# Patient Record
Sex: Female | Born: 1945 | Race: Black or African American | Hispanic: No | Marital: Married | Smoking: Never smoker
Health system: Southern US, Community
[De-identification: ages and names within clinical notes are randomized; demographics above are authoritative.]

## PROBLEM LIST (undated history)

## (undated) DIAGNOSIS — K219 Gastro-esophageal reflux disease without esophagitis: Secondary | ICD-10-CM

## (undated) DIAGNOSIS — E1165 Type 2 diabetes mellitus with hyperglycemia: Secondary | ICD-10-CM

## (undated) DIAGNOSIS — E1169 Type 2 diabetes mellitus with other specified complication: Secondary | ICD-10-CM

## (undated) DIAGNOSIS — R011 Cardiac murmur, unspecified: Secondary | ICD-10-CM

## (undated) DIAGNOSIS — G4730 Sleep apnea, unspecified: Secondary | ICD-10-CM

## (undated) DIAGNOSIS — E119 Type 2 diabetes mellitus without complications: Secondary | ICD-10-CM

## (undated) DIAGNOSIS — N183 Chronic kidney disease, stage 3 unspecified: Secondary | ICD-10-CM

## (undated) DIAGNOSIS — I10 Essential (primary) hypertension: Secondary | ICD-10-CM

## (undated) HISTORY — PX: CHOLECYSTECTOMY: SHX55

## (undated) HISTORY — PX: HERNIA REPAIR: SHX51

## (undated) HISTORY — PX: ABDOMINAL HYSTERECTOMY: SHX81

## (undated) HISTORY — DX: Type 2 diabetes mellitus with other specified complication: E11.69

## (undated) HISTORY — DX: Chronic kidney disease, stage 3 unspecified: N18.30

## (undated) HISTORY — PX: REPLACEMENT TOTAL KNEE: SUR1224

## (undated) HISTORY — DX: Type 2 diabetes mellitus with hyperglycemia: E11.65

---

## 2008-01-17 ENCOUNTER — Ambulatory Visit: Payer: Self-pay | Admitting: Family Medicine

## 2008-05-12 ENCOUNTER — Ambulatory Visit: Payer: Self-pay | Admitting: Cardiovascular Disease

## 2008-07-14 ENCOUNTER — Ambulatory Visit: Payer: Self-pay | Admitting: Internal Medicine

## 2008-08-11 ENCOUNTER — Ambulatory Visit: Payer: Self-pay | Admitting: Gastroenterology

## 2009-08-01 ENCOUNTER — Ambulatory Visit: Payer: Self-pay | Admitting: Internal Medicine

## 2010-08-10 ENCOUNTER — Ambulatory Visit: Payer: Self-pay | Admitting: Internal Medicine

## 2011-08-21 ENCOUNTER — Ambulatory Visit: Payer: Self-pay | Admitting: Internal Medicine

## 2012-03-20 ENCOUNTER — Emergency Department: Payer: Self-pay | Admitting: Urology

## 2012-03-20 LAB — CBC
HCT: 34.9 % — ABNORMAL LOW (ref 35.0–47.0)
HGB: 11.5 g/dL — ABNORMAL LOW (ref 12.0–16.0)
MCH: 28.6 pg (ref 26.0–34.0)
MCHC: 32.8 g/dL (ref 32.0–36.0)
MCV: 87 fL (ref 80–100)
Platelet: 234 10*3/uL (ref 150–440)
RBC: 4.01 10*6/uL (ref 3.80–5.20)

## 2012-03-20 LAB — COMPREHENSIVE METABOLIC PANEL
Albumin: 3.5 g/dL (ref 3.4–5.0)
Alkaline Phosphatase: 91 U/L (ref 50–136)
Anion Gap: 7 (ref 7–16)
BUN: 15 mg/dL (ref 7–18)
Calcium, Total: 8.4 mg/dL — ABNORMAL LOW (ref 8.5–10.1)
Chloride: 103 mmol/L (ref 98–107)
Co2: 28 mmol/L (ref 21–32)
EGFR (African American): >60
Glucose: 149 mg/dL — ABNORMAL HIGH (ref 65–99)
SGOT(AST): 7 U/L — ABNORMAL LOW (ref 15–37)
SGPT (ALT): 17 U/L (ref 12–78)
Sodium: 138 mmol/L (ref 136–145)

## 2012-03-20 LAB — PRO B NATRIURETIC PEPTIDE: B-Type Natriuretic Peptide: 538 pg/mL — ABNORMAL HIGH (ref 0–125)

## 2012-03-20 LAB — TROPONIN I: Troponin-I: <0.02 ng/mL

## 2012-08-26 ENCOUNTER — Ambulatory Visit: Payer: Self-pay | Admitting: Internal Medicine

## 2012-09-12 DIAGNOSIS — R809 Proteinuria, unspecified: Secondary | ICD-10-CM | POA: Insufficient documentation

## 2013-01-08 ENCOUNTER — Ambulatory Visit: Payer: Self-pay | Admitting: Gastroenterology

## 2013-04-27 DIAGNOSIS — E669 Obesity, unspecified: Secondary | ICD-10-CM | POA: Insufficient documentation

## 2013-04-27 DIAGNOSIS — N183 Chronic kidney disease, stage 3 unspecified: Secondary | ICD-10-CM | POA: Insufficient documentation

## 2013-04-27 DIAGNOSIS — N1832 Chronic kidney disease, stage 3b: Secondary | ICD-10-CM | POA: Insufficient documentation

## 2013-09-17 ENCOUNTER — Ambulatory Visit: Payer: Self-pay | Admitting: Internal Medicine

## 2014-08-23 ENCOUNTER — Other Ambulatory Visit: Payer: Self-pay | Admitting: Internal Medicine

## 2014-08-23 DIAGNOSIS — Z1231 Encounter for screening mammogram for malignant neoplasm of breast: Secondary | ICD-10-CM

## 2014-09-20 ENCOUNTER — Other Ambulatory Visit: Payer: Self-pay | Admitting: Internal Medicine

## 2014-09-20 ENCOUNTER — Ambulatory Visit
Admission: RE | Admit: 2014-09-20 | Discharge: 2014-09-20 | Disposition: A | Discharge status: Home / Self Care | Payer: Medicare PPO | Source: Ambulatory Visit | Attending: Internal Medicine | Admitting: Internal Medicine

## 2014-09-20 DIAGNOSIS — Z1231 Encounter for screening mammogram for malignant neoplasm of breast: Secondary | ICD-10-CM

## 2015-04-07 ENCOUNTER — Ambulatory Visit
Admission: RE | Admit: 2015-04-07 | Discharge: 2015-04-07 | Disposition: A | Discharge status: Home / Self Care | Payer: Medicare Other | Source: Ambulatory Visit | Attending: Internal Medicine | Admitting: Internal Medicine

## 2015-04-07 ENCOUNTER — Other Ambulatory Visit: Payer: Self-pay | Admitting: Internal Medicine

## 2015-04-07 DIAGNOSIS — M5489 Other dorsalgia: Secondary | ICD-10-CM | POA: Insufficient documentation

## 2015-04-07 DIAGNOSIS — M5136 Other intervertebral disc degeneration, lumbar region: Secondary | ICD-10-CM | POA: Insufficient documentation

## 2015-04-07 DIAGNOSIS — R109 Unspecified abdominal pain: Secondary | ICD-10-CM

## 2015-04-08 ENCOUNTER — Other Ambulatory Visit: Payer: Self-pay | Admitting: Internal Medicine

## 2015-04-08 DIAGNOSIS — R109 Unspecified abdominal pain: Secondary | ICD-10-CM

## 2015-04-28 ENCOUNTER — Ambulatory Visit: Payer: Medicare PPO

## 2015-07-11 ENCOUNTER — Ambulatory Visit
Admission: EM | Admit: 2015-07-11 | Discharge: 2015-07-11 | Disposition: A | Discharge status: Home / Self Care | Payer: Medicare Other | Attending: Family Medicine | Admitting: Family Medicine

## 2015-07-11 ENCOUNTER — Encounter: Payer: Self-pay | Admitting: Emergency Medicine

## 2015-07-11 DIAGNOSIS — L03115 Cellulitis of right lower limb: Secondary | ICD-10-CM | POA: Diagnosis not present

## 2015-07-11 DIAGNOSIS — L02419 Cutaneous abscess of limb, unspecified: Secondary | ICD-10-CM

## 2015-07-11 DIAGNOSIS — L02415 Cutaneous abscess of right lower limb: Secondary | ICD-10-CM

## 2015-07-11 DIAGNOSIS — L03119 Cellulitis of unspecified part of limb: Principal | ICD-10-CM

## 2015-07-11 DIAGNOSIS — W1809XA Striking against other object with subsequent fall, initial encounter: Secondary | ICD-10-CM

## 2015-07-11 HISTORY — DX: Type 2 diabetes mellitus without complications: E11.9

## 2015-07-11 HISTORY — DX: Essential (primary) hypertension: I10

## 2015-07-11 MED ORDER — MUPIROCIN 2 % EX OINT: 1.0000 "application " | TOPICAL_OINTMENT | Freq: Two times a day (BID) | CUTANEOUS | Status: DC

## 2015-07-11 MED ORDER — ACETAMINOPHEN 500 MG PO TABS: 500.0000 mg | ORAL_TABLET | Freq: Four times a day (QID) | ORAL | Status: AC | PRN

## 2015-07-11 MED ORDER — CEPHALEXIN 500 MG PO CAPS: 500.0000 mg | ORAL_CAPSULE | Freq: Two times a day (BID) | ORAL | Status: AC

## 2015-07-11 MED ORDER — TETANUS-DIPHTH-ACELL PERTUSSIS 5-2.5-18.5 LF-MCG/0.5 IM SUSP
0.5000 mL | Freq: Once | INTRAMUSCULAR | Status: AC
  Administered 2015-07-11: 0.5 mL via INTRAMUSCULAR

## 2015-07-11 NOTE — ED Notes (Signed)
Pt with injury to right lower leg and is not healing well. Pt is diabetic.

## 2015-07-11 NOTE — ED Provider Notes (Signed)
CSN: [id number]     Arrival date & time 07/11/15  [id number] History   First MD Initiated Contact with Patient 07/11/15 1053     Chief Complaint  Patient presents with  . Leg Injury   (Consider location/radiation/quality/duration/timing/severity/associated sxs/prior Treatment) HPI Comments: Married african [place] female here for evaluation of right leg pain/wound/swelling.  Tried hydrogen peroxide and neosporin without any improvement.  Purulent discharge yesterday.  Hit leg on cement block wall in her carport when watering plants with hose and fell.  Cannot remember when last tetanus booster was thinks greater than 10 years ago.  FBS 140 yesterday "her usual"  PSHx denied  The history is provided by the patient.    Past Medical History  Diagnosis Date  . Diabetes mellitus without complication ([place])   . Hypertension    History reviewed. No pertinent past surgical history. Family History  Problem Relation Age of Onset  . Breast cancer Neg Hx    Social History  Substance Use Topics  . Smoking status: Never Smoker   . Smokeless tobacco: None  . Alcohol Use: No   OB History    No data available     Review of Systems  Constitutional: Negative for fever, chills, diaphoresis, activity change, appetite change, fatigue and unexpected weight change.  HENT: Positive for sinus pressure. Negative for congestion, dental problem, drooling, ear discharge, ear pain, facial swelling, hearing loss, mouth sores, nosebleeds, postnasal drip, rhinorrhea, sneezing, sore throat, tinnitus, trouble swallowing and voice change.   Eyes: Negative for photophobia, pain, discharge, redness, itching and visual disturbance.  Respiratory: Negative for cough, choking, chest tightness, shortness of breath, wheezing and stridor.   Cardiovascular: Negative for chest pain, palpitations and leg swelling.  Gastrointestinal: Negative for nausea, vomiting, abdominal pain, diarrhea, constipation, blood in stool and abdominal  distention.  Endocrine: Negative for cold intolerance and heat intolerance.  Genitourinary: Negative for dysuria, hematuria and difficulty urinating.  Musculoskeletal: Positive for myalgias. Negative for back pain, joint swelling, arthralgias, gait problem, neck pain and neck stiffness.  Skin: Positive for color change, rash and wound. Negative for pallor.  Allergic/Immunologic: Negative for environmental allergies and food allergies.  Neurological: Negative for dizziness, tremors, seizures, syncope, facial asymmetry, speech difficulty, weakness, light-headedness, numbness and headaches.  Hematological: Negative for adenopathy. Does not bruise/bleed easily.  Psychiatric/Behavioral: Negative for behavioral problems, confusion, sleep disturbance and agitation.    Allergies  Review of patient's allergies indicates no known allergies.  Home Medications   Prior to Admission medications   Medication Sig Start Date End Date Taking? Authorizing Provider  glipiZIDE (GLUCOTROL XL) 10 MG 24 hr tablet Take 10 mg by mouth 2 (two) times daily.   Yes Historical Provider, MD  metFORMIN (GLUCOPHAGE) 1000 MG tablet Take 1,000 mg by mouth 2 (two) times daily with a meal.   Yes Historical Provider, MD  metoprolol succinate (TOPROL-XL) 50 MG 24 hr tablet Take 50 mg by mouth daily. Take with or immediately following a meal.   Yes Historical Provider, MD  acetaminophen (TYLENOL) 500 MG tablet Take 1 tablet (500 mg total) by mouth every 6 (six) hours as needed. 07/11/15   Olen Cordial, NP  cephALEXin (KEFLEX) 500 MG capsule Take 1 capsule (500 mg total) by mouth 2 (two) times daily. 07/11/15 07/21/15  Olen Cordial, NP  mupirocin ointment (BACTROBAN) 2 % Apply 1 application topically 2 (two) times daily. 07/11/15   Olen Cordial, NP   Meds Ordered and Administered this Visit   Medications  Tdap (BOOSTRIX) injection 0.5 mL (0.5 mLs Intramuscular Given 07/11/15 1117)    BP 146/69 mmHg  Pulse 58   Temp(Src) 98.9 F (37.2 C) (Oral)  Resp 18  Ht 5' (1.524 m)  Wt 190 lb (86.183 kg)  BMI 37.11 kg/m2  SpO2 98% No data found.   Physical Exam  Constitutional: She is oriented to person, place, and time. Vital signs are normal. She appears well-developed and well-nourished. She is active and cooperative.  Non-toxic appearance. She does not have a sickly appearance. She does not appear ill. No distress.  HENT:  Head: Normocephalic and atraumatic.  Right Ear: Hearing, external ear and ear canal normal.  Left Ear: Hearing, external ear and ear canal normal.  Nose: No mucosal edema, rhinorrhea, nose lacerations, sinus tenderness, nasal deformity, septal deviation or nasal septal hematoma. No epistaxis.  No foreign bodies. Right sinus exhibits no maxillary sinus tenderness and no frontal sinus tenderness. Left sinus exhibits no maxillary sinus tenderness and no frontal sinus tenderness.  Mouth/Throat: Uvula is midline and mucous membranes are normal. Mucous membranes are not pale, not dry and not cyanotic. She does not have dentures. No oral lesions. No trismus in the jaw. Normal dentition. No dental abscesses, uvula swelling, lacerations or dental caries. No oropharyngeal exudate, posterior oropharyngeal edema, posterior oropharyngeal erythema or tonsillar abscesses.  Eyes: Conjunctivae, EOM and lids are normal. Pupils are equal, round, and reactive to light. Right eye exhibits no chemosis, no discharge, no exudate and no hordeolum. No foreign body present in the right eye. Left eye exhibits no chemosis, no discharge, no exudate and no hordeolum. No foreign body present in the left eye. Right conjunctiva is not injected. Right conjunctiva has no hemorrhage. Left conjunctiva is not injected. Left conjunctiva has no hemorrhage. No scleral icterus. Right eye exhibits normal extraocular motion and no nystagmus. Left eye exhibits normal extraocular motion and no nystagmus. Right pupil is round and reactive.  Left pupil is round and reactive. Pupils are equal.  Neck: Trachea normal and normal range of motion. Neck supple. No tracheal tenderness, no spinous process tenderness and no muscular tenderness present. No rigidity. No tracheal deviation, no edema, no erythema and normal range of motion present. No thyroid mass and no thyromegaly present.  Cardiovascular: Normal rate, regular rhythm, S1 normal, S2 normal, normal heart sounds and intact distal pulses.  PMI is not displaced.  Exam reveals no gallop and no friction rub.   No murmur heard. Pulses:      Radial pulses are 2+ on the right side, and 2+ on the left side.  Pulmonary/Chest: Effort normal and breath sounds normal. No accessory muscle usage or stridor. No respiratory distress. She has no decreased breath sounds. She has no wheezes. She has no rhonchi. She has no rales. She exhibits no tenderness.  Abdominal: Soft. She exhibits no distension.  Musculoskeletal: Normal range of motion.       Right shoulder: Normal.       Left shoulder: Normal.       Right hip: Normal.       Left hip: Normal.       Right knee: Normal.       Left knee: Normal.       Cervical back: Normal.       Right hand: Normal.       Left hand: Normal.       Right lower leg: She exhibits tenderness, swelling and edema. She exhibits no bony tenderness, no deformity and no laceration.  Left lower leg: Normal.       Legs: Lymphadenopathy:       Head (right side): No submental, no submandibular, no tonsillar, no preauricular, no posterior auricular and no occipital adenopathy present.       Head (left side): No submental, no submandibular, no tonsillar, no preauricular, no posterior auricular and no occipital adenopathy present.    She has no cervical adenopathy.       Right cervical: No superficial cervical, no deep cervical and no posterior cervical adenopathy present.      Left cervical: No superficial cervical, no deep cervical and no posterior cervical adenopathy  present.  Neurological: She is alert and oriented to person, place, and time. She has normal strength. She is not disoriented. She displays no atrophy and no tremor. No cranial nerve deficit or sensory deficit. She exhibits normal muscle tone. She displays no seizure activity. Coordination and gait normal. GCS eye subscore is 4. GCS verbal subscore is 5. GCS motor subscore is 6.  Skin: Skin is warm and dry. Abrasion and rash noted. No bruising, no burn, no ecchymosis, no laceration, no lesion, no petechiae and no purpura noted. Rash is macular and maculopapular. Rash is not papular, not nodular, not vesicular and not urticarial. She is not diaphoretic. No cyanosis or erythema. No pallor. Nails show no clubbing.     Centrally white scab 1x2cm with 2-3cm macular erythema surrounding TTP dry blanches  Psychiatric: She has a normal mood and affect. Her speech is normal and behavior is normal. Judgment and thought content normal. Cognition and memory are normal.  Nursing note and vitals reviewed.   ED Course  Procedures (including critical care time)  Labs Review Labs Reviewed - No data to display  Imaging Review No results found.   1117 tdap booster IM administered by RN Claybon Jabs  MDM   1. Cellulitis and abscess of leg    Fell over cement block wall in carport hydrogen peroxide and neosporin not working spreading erythema/edema/tenderness pain.  Tetanus booster today as cannot remember last one greater than 10 years called PCM office and they do not administer and she cannot remember going to public health for shot.  Bactrim interacts with metformin and glipizide increase drug levels so will use keflex 500mg  po BID x 14 days and bactroban ointment BID until healed.  Patient to wash with soapy water twice a day.  May apply ice packs prn pain/swelling. Tylenol 500mg  po QID prn pain.  Exitcare handout on skin infection given to patient.  RTC if worsening erythema, pain, purulent discharge,  fever.  Wash towels, washcloths, sheets in hot water with bleach every couple of days until infection resolved.  Patient verbalized understanding, agreed with plan of care and had no further questions at this time.      Olen Cordial, NP 07/11/15 [place], NP 07/11/15 1147

## 2015-07-11 NOTE — Discharge Instructions (Signed)
Cellulitis Cellulitis is an infection of the skin and the tissue beneath it. The infected area is usually red and tender. Cellulitis occurs most often in the arms and lower legs.  CAUSES  Cellulitis is caused by bacteria that enter the skin through cracks or cuts in the skin. The most common types of bacteria that cause cellulitis are staphylococci and streptococci. SIGNS AND SYMPTOMS   Redness and warmth.  Swelling.  Tenderness or pain.  Fever. DIAGNOSIS  Your health care provider can usually determine what is wrong based on a physical exam. Blood tests may also be done. TREATMENT  Treatment usually involves taking an antibiotic medicine. HOME CARE INSTRUCTIONS   Take your antibiotic medicine as directed by your health care provider. Finish the antibiotic even if you start to feel better.  Keep the infected arm or leg elevated to reduce swelling.  Apply a warm cloth to the affected area up to 4 times per day to relieve pain.  Take medicines only as directed by your health care provider.  Keep all follow-up visits as directed by your health care provider. SEEK MEDICAL CARE IF:   You notice red streaks coming from the infected area.  Your red area gets larger or turns dark in color.  Your bone or joint underneath the infected area becomes painful after the skin has healed.  Your infection returns in the same area or another area.  You notice a swollen bump in the infected area.  You develop new symptoms.  You have a fever. SEEK IMMEDIATE MEDICAL CARE IF:   You feel very sleepy.  You develop vomiting or diarrhea.  You have a general ill feeling (malaise) with muscle aches and pains.   This information is not intended to replace advice given to you by your health care provider. Make sure you discuss any questions you have with your health care provider.   Document Released: 11/22/2004 Document Revised: 11/03/2014 Document Reviewed: 04/30/2011 Elsevier Interactive  Patient Education 2016 [employer]. Tdap Vaccine (Tetanus, Diphtheria and Pertussis): What You Need to Know 1. Why get vaccinated? Tetanus, diphtheria and pertussis are very serious diseases. Tdap vaccine can protect [place] from these diseases. And, Tdap vaccine given to pregnant women can protect newborn babies against pertussis. TETANUS (Lockjaw) is rare in the [place] today. It causes painful muscle tightening and stiffness, usually all over the body.  It can lead to tightening of muscles in the head and neck so you can't open your mouth, swallow, or sometimes even breathe. Tetanus kills about 1 out of 10 people who are infected even after receiving the best medical care. DIPHTHERIA is also rare in the [place] today. It can cause a thick coating to form in the back of the throat.  It can lead to breathing problems, heart failure, paralysis, and death. PERTUSSIS (Whooping Cough) causes severe coughing spells, which can cause difficulty breathing, vomiting and disturbed sleep.  It can also lead to weight loss, incontinence, and rib fractures. Up to 2 in 100 adolescents and 5 in 100 adults with pertussis are hospitalized or have complications, which could include pneumonia or death. These diseases are caused by bacteria. Diphtheria and pertussis are spread from person to person through secretions from coughing or sneezing. Tetanus enters the body through cuts, scratches, or wounds. Before vaccines, as many as 200,000 cases of diphtheria, 200,000 cases of pertussis, and hundreds of cases of tetanus, were reported in the [place] each year. Since vaccination began, reports of cases for  tetanus and diphtheria have dropped by about 99% and for pertussis by about 80%. 2. Tdap vaccine Tdap vaccine can protect adolescents and adults from tetanus, diphtheria, and pertussis. One dose of Tdap is routinely given at age 50 or 55. People who did not get Tdap at that age should get it as soon as  possible. Tdap is especially important for healthcare professionals and anyone having close contact with a baby younger than 12 months. Pregnant women should get a dose of Tdap during every pregnancy, to protect the newborn from pertussis. Infants are most at risk for severe, life-threatening complications from pertussis. Another vaccine, called Td, protects against tetanus and diphtheria, but not pertussis. A Td booster should be given every 10 years. Tdap may be given as one of these boosters if you have never gotten Tdap before. Tdap may also be given after a severe cut or burn to prevent tetanus infection. Your doctor or the person giving you the vaccine can give you more information. Tdap may safely be given at the same time as other vaccines. 3. Some people should not get this vaccine  A person who has ever had a life-threatening allergic reaction after a previous dose of any diphtheria, tetanus or pertussis containing vaccine, OR has a severe allergy to any part of this vaccine, should not get Tdap vaccine. Tell the person giving the vaccine about any severe allergies.  Anyone who had coma or long repeated seizures within 7 days after a childhood dose of DTP or DTaP, or a previous dose of Tdap, should not get Tdap, unless a cause other than the vaccine was found. They can still get Td.  Talk to your doctor if you:  have seizures or another nervous system problem,  had severe pain or swelling after any vaccine containing diphtheria, tetanus or pertussis,  ever had a condition called Guillain-Barr Syndrome (GBS),  aren't feeling well on the day the shot is scheduled. 4. Risks With any medicine, including vaccines, there is a chance of side effects. These are usually mild and go away on their own. Serious reactions are also possible but are rare. Most people who get Tdap vaccine do not have any problems with it. Mild problems following Tdap (Did not interfere with activities)  Pain  where the shot was given (about 3 in 4 adolescents or 2 in 3 adults)  Redness or swelling where the shot was given (about 1 person in 5)  Mild fever of at least 100.49F (up to about 1 in 25 adolescents or 1 in 100 adults)  Headache (about 3 or 4 people in 10)  Tiredness (about 1 person in 3 or 4)  Nausea, vomiting, diarrhea, stomach ache (up to 1 in 4 adolescents or 1 in 10 adults)  Chills, sore joints (about 1 person in 10)  Body aches (about 1 person in 3 or 4)  Rash, swollen glands (uncommon) Moderate problems following Tdap (Interfered with activities, but did not require medical attention)  Pain where the shot was given (up to 1 in 5 or 6)  Redness or swelling where the shot was given (up to about 1 in 16 adolescents or 1 in 12 adults)  Fever over 102F (about 1 in 100 adolescents or 1 in 250 adults)  Headache (about 1 in 7 adolescents or 1 in 10 adults)  Nausea, vomiting, diarrhea, stomach ache (up to 1 or 3 people in 100)  Swelling of the entire arm where the shot was given (up to about 1  in 500). Severe problems following Tdap (Unable to perform usual activities; required medical attention)  Swelling, severe pain, bleeding and redness in the arm where the shot was given (rare). Problems that could happen after any vaccine:  People sometimes faint after a medical procedure, including vaccination. Sitting or lying down for about 15 minutes can help prevent fainting, and injuries caused by a fall. Tell your doctor if you feel dizzy, or have vision changes or ringing in the ears.  Some people get severe pain in the shoulder and have difficulty moving the arm where a shot was given. This happens very rarely.  Any medication can cause a severe allergic reaction. Such reactions from a vaccine are very rare, estimated at fewer than 1 in a million doses, and would happen within a few minutes to a few hours after the vaccination. As with any medicine, there is a very remote  chance of a vaccine causing a serious injury or death. The safety of vaccines is always being monitored. For more information, visit: http://www.aguilar.org/ 5. What if there is a serious problem? What should I look for?  Look for anything that concerns you, such as signs of a severe allergic reaction, very high fever, or unusual behavior.  Signs of a severe allergic reaction can include hives, swelling of the face and throat, difficulty breathing, a fast heartbeat, dizziness, and weakness. These would usually start a few minutes to a few hours after the vaccination. What should I do?  If you think it is a severe allergic reaction or other emergency that can't wait, call 9-1-1 or get the person to the nearest hospital. Otherwise, call your doctor.  Afterward, the reaction should be reported to the Vaccine Adverse Event Reporting System (VAERS). Your doctor might file this report, or you can do it yourself through the VAERS web site at www.vaers.SamedayNews.es, or by calling (956) 380-3411. VAERS does not give medical advice.  6. The National Vaccine Injury Compensation Program The [employer] Vaccine Injury Compensation Program (VICP) is a federal program that was created to compensate people who may have been injured by certain vaccines. Persons who believe they may have been injured by a vaccine can learn about the program and about filing a claim by calling 334-055-6572 or visiting the [place] website at GoldCloset.com.ee. There is a time limit to file a claim for compensation. 7. How can I learn more?  Ask your doctor. He or she can give you the vaccine package insert or suggest other sources of information.  Call your local or state health department.  Contact the Centers for Disease Control and Prevention (CDC):  Call 347-800-1772 (1-800-CDC-INFO) or  Visit CDC's website at http://hunter.com/ CDC Tdap Vaccine VIS (04/21/13)   This information is not intended to replace  advice given to you by your health care provider. Make sure you discuss any questions you have with your health care provider.   Document Released: 08/14/2011 Document Revised: 03/05/2014 Document Reviewed: 05/27/2013 Elsevier Interactive Patient Education [employer].

## 2015-09-02 ENCOUNTER — Other Ambulatory Visit: Payer: Self-pay | Admitting: Internal Medicine

## 2015-09-02 DIAGNOSIS — Z1231 Encounter for screening mammogram for malignant neoplasm of breast: Secondary | ICD-10-CM

## 2015-09-06 ENCOUNTER — Telehealth: Payer: Self-pay | Admitting: Gastroenterology

## 2015-09-06 NOTE — Telephone Encounter (Signed)
colonoscopy

## 2015-09-06 NOTE — Telephone Encounter (Signed)
Called pt, not a good time to talk, tomorrow at 9 am will be better. Will call pt tomorrow morning to schedule her colonoscopy.

## 2015-09-07 ENCOUNTER — Telehealth: Payer: Self-pay

## 2015-09-07 ENCOUNTER — Other Ambulatory Visit: Payer: Self-pay

## 2015-09-07 NOTE — Telephone Encounter (Signed)
Gastroenterology Pre-Procedure Review  Request Date: 10/17/2015    Requesting Physician: Dr. Rosario Jacks   PATIENT REVIEW QUESTIONS: The patient responded to the following health history questions as indicated:    1. Are you having any GI issues? no 2. 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 Do you have a family history of Colon Cancer or Polyps? no 4. Diabetes Mellitus? yes (Type 2) 5. Joint replacements in the past 12 months?no 6. Major health problems in the past 3 months?no 7. Any artificial heart valves, MVP, or defibrillator?no    MEDICATIONS & ALLERGIES:    Patient reports the following regarding taking any anticoagulation/antiplatelet therapy:   Plavix, Coumadin, Eliquis, Xarelto, Lovenox, Pradaxa, Brilinta, or Effient? no Aspirin? yes (Blood thinner)  Patient confirms/reports the following medications:  Current Outpatient Prescriptions  Medication Sig Dispense Refill  . acetaminophen (TYLENOL) 500 MG tablet Take 1 tablet (500 mg total) by mouth every 6 (six) hours as needed. 30 tablet 0  . amLODipine (NORVASC) 10 MG tablet Take 10 mg by mouth daily.    . benazepril (LOTENSIN) 40 MG tablet Take 40 mg by mouth daily.    . chlorthalidone (HYGROTON) 25 MG tablet Take 25 mg by mouth daily.    Marland Kitchen glipiZIDE (GLUCOTROL XL) 10 MG 24 hr tablet Take 10 mg by mouth 2 (two) times daily.    . Liraglutide (VICTOZA) 18 MG/3ML SOPN Inject 1.8 mg into the skin once.    . lovastatin (MEVACOR) 40 MG tablet Take 40 mg by mouth at bedtime.    . metFORMIN (GLUCOPHAGE) 1000 MG tablet Take 1,000 mg by mouth 2 (two) times daily with a meal.    . metoprolol succinate (TOPROL-XL) 50 MG 24 hr tablet Take 50 mg by mouth daily. Take with or immediately following a meal.    . omeprazole (PRILOSEC) 20 MG capsule Take 20 mg by mouth daily.    Marland Kitchen spironolactone (ALDACTONE) 25 MG tablet Take 25 mg by mouth daily.    . mupirocin ointment (BACTROBAN) 2 % Apply 1 application topically 2 (two) times  daily. (Patient not taking: Reported on 09/07/2015) 22 g 0   No current facility-administered medications for this visit.    Patient confirms/reports the following allergies:  No Known Allergies  No orders of the defined types were placed in this encounter.    AUTHORIZATION INFORMATION Primary Insurance: 1D#: Group #:  Secondary Insurance: 1D#: Group #:  SCHEDULE INFORMATION: Date: 10/17/2015   Time: Location: [facility]

## 2015-09-07 NOTE — Telephone Encounter (Signed)
Scheduled 10/17/2015 at [facility]. Colonoscopy screening routine [id number] Please pre-cert

## 2015-09-21 ENCOUNTER — Ambulatory Visit
Admission: RE | Admit: 2015-09-21 | Discharge: 2015-09-21 | Disposition: A | Discharge status: Home / Self Care | Payer: Medicare Other | Source: Ambulatory Visit | Attending: Internal Medicine | Admitting: Internal Medicine

## 2015-09-21 DIAGNOSIS — Z1231 Encounter for screening mammogram for malignant neoplasm of breast: Secondary | ICD-10-CM

## 2015-10-06 ENCOUNTER — Encounter: Payer: Self-pay | Admitting: *Deleted

## 2015-10-13 NOTE — Discharge Instructions (Signed)

## 2015-10-17 ENCOUNTER — Ambulatory Visit: Payer: Medicare Other | Admitting: Anesthesiology

## 2015-10-17 ENCOUNTER — Encounter
Admission: RE | Disposition: A | Discharge status: Home / Self Care | Payer: Self-pay | Source: Ambulatory Visit | Attending: Gastroenterology

## 2015-10-17 ENCOUNTER — Ambulatory Visit
Admission: RE | Admit: 2015-10-17 | Discharge: 2015-10-17 | Disposition: A | Discharge status: Home / Self Care | Payer: Medicare Other | Source: Ambulatory Visit | Attending: Gastroenterology | Admitting: Gastroenterology

## 2015-10-17 ENCOUNTER — Encounter: Payer: Self-pay | Admitting: Anesthesiology

## 2015-10-17 DIAGNOSIS — K219 Gastro-esophageal reflux disease without esophagitis: Secondary | ICD-10-CM | POA: Diagnosis not present

## 2015-10-17 DIAGNOSIS — R011 Cardiac murmur, unspecified: Secondary | ICD-10-CM | POA: Insufficient documentation

## 2015-10-17 DIAGNOSIS — I10 Essential (primary) hypertension: Secondary | ICD-10-CM | POA: Diagnosis not present

## 2015-10-17 DIAGNOSIS — D122 Benign neoplasm of ascending colon: Secondary | ICD-10-CM | POA: Diagnosis not present

## 2015-10-17 DIAGNOSIS — Z6836 Body mass index (BMI) 36.0-36.9, adult: Secondary | ICD-10-CM | POA: Diagnosis not present

## 2015-10-17 DIAGNOSIS — Z9049 Acquired absence of other specified parts of digestive tract: Secondary | ICD-10-CM | POA: Diagnosis not present

## 2015-10-17 DIAGNOSIS — Z79899 Other long term (current) drug therapy: Secondary | ICD-10-CM | POA: Insufficient documentation

## 2015-10-17 DIAGNOSIS — Z7984 Long term (current) use of oral hypoglycemic drugs: Secondary | ICD-10-CM | POA: Diagnosis not present

## 2015-10-17 DIAGNOSIS — Z1211 Encounter for screening for malignant neoplasm of colon: Secondary | ICD-10-CM

## 2015-10-17 DIAGNOSIS — G4730 Sleep apnea, unspecified: Secondary | ICD-10-CM | POA: Insufficient documentation

## 2015-10-17 DIAGNOSIS — Z96653 Presence of artificial knee joint, bilateral: Secondary | ICD-10-CM | POA: Insufficient documentation

## 2015-10-17 DIAGNOSIS — K5730 Diverticulosis of large intestine without perforation or abscess without bleeding: Secondary | ICD-10-CM | POA: Diagnosis not present

## 2015-10-17 DIAGNOSIS — E669 Obesity, unspecified: Secondary | ICD-10-CM | POA: Diagnosis not present

## 2015-10-17 DIAGNOSIS — Z7982 Long term (current) use of aspirin: Secondary | ICD-10-CM | POA: Insufficient documentation

## 2015-10-17 DIAGNOSIS — K641 Second degree hemorrhoids: Secondary | ICD-10-CM | POA: Diagnosis not present

## 2015-10-17 DIAGNOSIS — E119 Type 2 diabetes mellitus without complications: Secondary | ICD-10-CM | POA: Diagnosis not present

## 2015-10-17 HISTORY — DX: Gastro-esophageal reflux disease without esophagitis: K21.9

## 2015-10-17 HISTORY — DX: Sleep apnea, unspecified: G47.30

## 2015-10-17 HISTORY — DX: Cardiac murmur, unspecified: R01.1

## 2015-10-17 HISTORY — PX: POLYPECTOMY: SHX5525

## 2015-10-17 HISTORY — PX: COLONOSCOPY WITH PROPOFOL: SHX5780

## 2015-10-17 LAB — GLUCOSE, CAPILLARY
Glucose-Capillary: 73 mg/dL (ref 65–99)
Glucose-Capillary: 87 mg/dL (ref 65–99)

## 2015-10-17 SURGERY — COLONOSCOPY WITH PROPOFOL
Anesthesia: Monitor Anesthesia Care | Wound class: Contaminated

## 2015-10-17 MED ORDER — PROPOFOL 10 MG/ML IV BOLUS
INTRAVENOUS | Status: DC | PRN
  Administered 2015-10-17 (×4): 20 mg via INTRAVENOUS
  Administered 2015-10-17: 50 mg via INTRAVENOUS
  Administered 2015-10-17: 20 mg via INTRAVENOUS
  Administered 2015-10-17: 10 mg via INTRAVENOUS

## 2015-10-17 MED ORDER — OXYCODONE HCL 5 MG PO TABS: 5.0000 mg | ORAL_TABLET | Freq: Once | ORAL | Status: DC | PRN

## 2015-10-17 MED ORDER — LACTATED RINGERS IV SOLN
INTRAVENOUS | Status: DC
  Administered 2015-10-17: 07:00:00 via INTRAVENOUS

## 2015-10-17 MED ORDER — LIDOCAINE HCL (CARDIAC) 20 MG/ML IV SOLN
INTRAVENOUS | Status: DC | PRN
  Administered 2015-10-17: 50 mg via INTRAVENOUS

## 2015-10-17 MED ORDER — OXYCODONE HCL 5 MG/5ML PO SOLN: 5.0000 mg | Freq: Once | ORAL | Status: DC | PRN

## 2015-10-17 SURGICAL SUPPLY — 23 items

## 2015-10-17 NOTE — Anesthesia Preprocedure Evaluation (Signed)
Anesthesia Evaluation  Patient identified by MRN, date of birth, ID band  Reviewed: NPO status   History of Anesthesia Complications Negative for: history of anesthetic complications  Airway Mallampati: II  TM Distance: >3 FB Neck ROM: full    Dental  (+) Chipped,    Pulmonary sleep apnea (does not use cpap) ,    Pulmonary exam normal        Cardiovascular Exercise Tolerance: Good hypertension, Normal cardiovascular exam     Neuro/Psych negative neurological ROS  negative psych ROS   GI/Hepatic Neg liver ROS, GERD  Controlled,  Endo/Other  diabetesMorbid obesity (bmi=36)  Renal/GU negative Renal ROS  negative genitourinary   Musculoskeletal   Abdominal   Peds  Hematology negative hematology ROS (+)   Anesthesia Other Findings   Reproductive/Obstetrics                             Anesthesia Physical Anesthesia Plan  ASA: II  Anesthesia Plan: MAC   Post-op Pain Management:    Induction:   Airway Management Planned:   Additional Equipment:   Intra-op Plan:   Post-operative Plan:   Informed Consent: I have reviewed the patients History and Physical, chart, labs and discussed the procedure including the risks, benefits and alternatives for the proposed anesthesia with the patient or authorized representative who has indicated his/her understanding and acceptance.     Plan Discussed with: CRNA  Anesthesia Plan Comments:         Anesthesia Quick Evaluation

## 2015-10-17 NOTE — Anesthesia Procedure Notes (Signed)
Procedure Name: MAC Performed by: Rolando Hessling Pre-anesthesia Checklist: Patient identified, Emergency Drugs available, Suction available, Timeout performed and Patient being monitored Patient Re-evaluated:Patient Re-evaluated prior to inductionOxygen Delivery Method: Nasal cannula Placement Confirmation: positive ETCO2       

## 2015-10-17 NOTE — Transfer of Care (Signed)
Immediate Anesthesia Transfer of Care Note  Patient: Cheryl Saunders  Procedure(s) Performed: Procedure(s) with comments: COLONOSCOPY WITH PROPOFOL (N/A) - Diabetic - oral meds Sleep apnea POLYPECTOMY - Ascending colon polyps x 2  Patient Location: PACU  Anesthesia Type: MAC  Level of Consciousness: awake, alert  and patient cooperative  Airway and Oxygen Therapy: Patient Spontanous Breathing and Patient connected to supplemental oxygen  Post-op Assessment: Post-op Vital signs reviewed, Patient's Cardiovascular Status Stable, Respiratory Function Stable, Patent Airway and No signs of Nausea or vomiting  Post-op Vital Signs: Reviewed and stable  Complications: No apparent anesthesia complications

## 2015-10-17 NOTE — Op Note (Signed)
[facility] Patient Name: Cheryl Saunders Procedure Date: 10/17/2015 7:47 AM MRN: [id number] Account #: [id number] Date of Birth: [birth year] Admit Type: Outpatient Age: 70 Room: [facility] OR ROOM 01 Gender: Female Note Status: Finalized Procedure:            Colonoscopy Indications:          Screening for colorectal malignant neoplasm Providers:            [name] MD, MD Referring MD:         Casilda Carls, MD (Referring MD) Medicines:            Propofol per Anesthesia Complications:        No immediate complications. Procedure:            Pre-Anesthesia Assessment:                       - Prior to the procedure, a History and Physical was                        performed, and patient medications and allergies were                        reviewed. The patient's tolerance of previous                        anesthesia was also reviewed. The risks and benefits of                        the procedure and the sedation options and risks were                        discussed with the patient. All questions were                        answered, and informed consent was obtained. Prior                        Anticoagulants: The patient has taken no previous                        anticoagulant or antiplatelet agents. ASA Grade                        Assessment: II - A patient with mild systemic disease.                        After reviewing the risks and benefits, the patient was                        deemed in satisfactory condition to undergo the                        procedure.                       After obtaining informed consent, the colonoscope was                        passed under direct vision. Throughout the procedure,  the patient's blood pressure, pulse, and oxygen                        saturations were monitored continuously. The Olympus                        CF-HQ190L Colonoscope (S#. [id number]) was introduced                 through the anus and advanced to the the cecum,                        identified by appendiceal orifice and ileocecal valve.                        The colonoscopy was performed without difficulty. The                        patient tolerated the procedure well. The quality of                        the bowel preparation was excellent. Findings:      The perianal and digital rectal examinations were normal.      Two sessile polyps were found in the ascending colon. The polyps were 2       to 3 mm in size. These polyps were removed with a cold biopsy forceps.       Resection and retrieval were complete.      Multiple small-mouthed diverticula were found in the sigmoid colon.      Non-bleeding internal hemorrhoids were found during retroflexion. The       hemorrhoids were Grade II (internal hemorrhoids that prolapse but reduce       spontaneously). Impression:           - Two 2 to 3 mm polyps in the ascending colon, removed                        with a cold biopsy forceps. Resected and retrieved.                       - Diverticulosis in the sigmoid colon.                       - Non-bleeding internal hemorrhoids. Recommendation:       - Await pathology results.                       - Repeat colonoscopy in 5 years if polyp adenoma and 10                        years if hyperplastic Procedure Code(s):    --- Professional ---                       [phone], Colonoscopy, flexible; with biopsy, single or                        multiple Diagnosis Code(s):    --- Professional ---                       Z12.11, Encounter for screening for malignant neoplasm  of colon                       D12.2, Benign neoplasm of ascending colon CPT copyright 2016 American Medical Association. All rights reserved. The codes documented in this report are preliminary and upon coder review may  be revised to meet current compliance requirements. [name] MD, MD 10/17/2015  8:11:09 AM This report has been signed electronically. Number of Addenda: 0 Note Initiated On: 10/17/2015 7:47 AM Scope Withdrawal Time: 0 hours 6 minutes 41 seconds  Total Procedure Duration: 0 hours 13 minutes 14 seconds       [facility]

## 2015-10-17 NOTE — Anesthesia Postprocedure Evaluation (Signed)
Anesthesia Post Note  Patient: Cheryl Saunders  Procedure(s) Performed: Procedure(s) (LRB): COLONOSCOPY WITH PROPOFOL (N/A) POLYPECTOMY  Patient location during evaluation: PACU Anesthesia Type: MAC Level of consciousness: awake and alert Pain management: pain level controlled Vital Signs Assessment: post-procedure vital signs reviewed and stable Respiratory status: spontaneous breathing, nonlabored ventilation, respiratory function stable and patient connected to nasal cannula oxygen Cardiovascular status: stable and blood pressure returned to baseline Anesthetic complications: no    Carri Spillers

## 2015-10-17 NOTE — H&P (Signed)
Cheryl Lame, MD [address] [place], [place] [address] Phone: [phone] Fax : [phone]  Primary Care Physician:  Casilda Carls, MD Primary Gastroenterologist:  Dr. Allen Norris  Pre-Procedure History & Physical: HPI:  Cheryl Saunders is a 70 y.o. female is here for a screening colonoscopy.   Past Medical History:  Diagnosis Date  . Diabetes mellitus without complication ([place])   . GERD (gastroesophageal reflux disease)   . Heart murmur    followed by PCP  . Hypertension   . Sleep apnea    supposed to use CPAP. Does not.    Past Surgical History:  Procedure Laterality Date  . ABDOMINAL HYSTERECTOMY    . CHOLECYSTECTOMY    . HERNIA REPAIR    . REPLACEMENT TOTAL KNEE Bilateral     Prior to Admission medications   Medication Sig Start Date End Date Taking? Authorizing Provider  acetaminophen (TYLENOL) 500 MG tablet Take 1 tablet (500 mg total) by mouth every 6 (six) hours as needed. 07/11/15  Yes Olen Cordial, NP  amLODipine (NORVASC) 10 MG tablet Take 10 mg by mouth daily.   Yes Historical Provider, MD  aspirin 81 MG tablet Take 81 mg by mouth daily.   Yes Historical Provider, MD  benazepril (LOTENSIN) 40 MG tablet Take 40 mg by mouth daily.   Yes Historical Provider, MD  chlorthalidone (HYGROTON) 25 MG tablet Take 25 mg by mouth daily.   Yes Historical Provider, MD  Cholecalciferol (VITAMIN D3 PO) Take by mouth daily.   Yes Historical Provider, MD  glipiZIDE (GLUCOTROL XL) 10 MG 24 hr tablet Take 10 mg by mouth 2 (two) times daily.   Yes Historical Provider, MD  Liraglutide (VICTOZA) 18 MG/3ML SOPN Inject 1.8 mg into the skin once.   Yes Historical Provider, MD  lovastatin (MEVACOR) 40 MG tablet Take 40 mg by mouth at bedtime.   Yes Historical Provider, MD  metFORMIN (GLUCOPHAGE) 1000 MG tablet Take 1,000 mg by mouth 2 (two) times daily with a meal.   Yes Historical Provider, MD  metoprolol succinate (TOPROL-XL) 50 MG 24 hr tablet Take 50 mg by mouth daily.  Take with or immediately following a meal.   Yes Historical Provider, MD  omeprazole (PRILOSEC) 20 MG capsule Take 20 mg by mouth daily.   Yes Historical Provider, MD  spironolactone (ALDACTONE) 25 MG tablet Take 25 mg by mouth daily.   Yes Historical Provider, MD  mupirocin ointment (BACTROBAN) 2 % Apply 1 application topically 2 (two) times daily. Patient not taking: Reported on 09/07/2015 07/11/15   Olen Cordial, NP    Allergies as of 09/07/2015  . (No Known Allergies)    Family History  Problem Relation Age of Onset  . Breast cancer Neg Hx     Social History   Social History  . Marital status: Married    Spouse name: N/A  . Number of children: N/A  . Years of education: N/A   Occupational History  . Not on file.   Social History Main Topics  . Smoking status: Never Smoker  . Smokeless tobacco: Never Used  . Alcohol use No  . Drug use: Unknown  . Sexual activity: Not on file   Other Topics Concern  . Not on file   Social History Narrative  . No narrative on file    Review of Systems: See HPI, otherwise negative ROS  Physical Exam: BP 129/60   Pulse 69   Temp 98.6 F (37 C)  Resp 16   Ht 5' (1.524 m)   Wt 186 lb (84.4 kg)   SpO2 98%   BMI 36.33 kg/m  General:   Alert,  pleasant and cooperative in NAD Head:  Normocephalic and atraumatic. Neck:  Supple; no masses or thyromegaly. Lungs:  Clear throughout to auscultation.    Heart:  Regular rate and rhythm. Abdomen:  Soft, nontender and nondistended. Normal bowel sounds, without guarding, and without rebound.   Neurologic:  Alert and  oriented x4;  grossly normal neurologically.  Impression/Plan: [name] is now here to undergo a screening colonoscopy.  Risks, benefits, and alternatives regarding colonoscopy have been reviewed with the patient.  Questions have been answered.  All parties agreeable.

## 2015-10-18 ENCOUNTER — Encounter: Payer: Self-pay | Admitting: Gastroenterology

## 2015-10-19 ENCOUNTER — Encounter: Payer: Self-pay | Admitting: Gastroenterology

## 2015-10-20 ENCOUNTER — Encounter: Payer: Self-pay | Admitting: Gastroenterology

## 2015-12-02 ENCOUNTER — Ambulatory Visit
Admission: RE | Admit: 2015-12-02 | Discharge: 2015-12-02 | Disposition: A | Discharge status: Home / Self Care | Payer: Medicare Other | Source: Ambulatory Visit | Attending: Internal Medicine | Admitting: Internal Medicine

## 2015-12-02 ENCOUNTER — Other Ambulatory Visit: Payer: Self-pay | Admitting: Internal Medicine

## 2015-12-02 DIAGNOSIS — M50322 Other cervical disc degeneration at C5-C6 level: Secondary | ICD-10-CM | POA: Diagnosis not present

## 2015-12-02 DIAGNOSIS — M542 Cervicalgia: Secondary | ICD-10-CM

## 2015-12-02 DIAGNOSIS — M50323 Other cervical disc degeneration at C6-C7 level: Secondary | ICD-10-CM | POA: Diagnosis not present

## 2016-09-08 ENCOUNTER — Encounter: Payer: Self-pay | Admitting: *Deleted

## 2016-09-08 ENCOUNTER — Ambulatory Visit
Admission: EM | Admit: 2016-09-08 | Discharge: 2016-09-08 | Disposition: A | Discharge status: Home / Self Care | Payer: Medicare Other | Attending: Family Medicine | Admitting: Family Medicine

## 2016-09-08 DIAGNOSIS — W57XXXA Bitten or stung by nonvenomous insect and other nonvenomous arthropods, initial encounter: Secondary | ICD-10-CM | POA: Diagnosis not present

## 2016-09-08 DIAGNOSIS — S80861A Insect bite (nonvenomous), right lower leg, initial encounter: Secondary | ICD-10-CM

## 2016-09-08 DIAGNOSIS — L03115 Cellulitis of right lower limb: Secondary | ICD-10-CM | POA: Diagnosis not present

## 2016-09-08 MED ORDER — DOXYCYCLINE HYCLATE 100 MG PO TABS: 100.0000 mg | ORAL_TABLET | Freq: Two times a day (BID) | ORAL | 0 refills | Status: DC

## 2016-09-08 NOTE — ED Triage Notes (Signed)
Pt removed a tick from the posterior aspect of her right calf. Now the site appears red and raised, and is tender to touch. Pt c/o right knee pain, pedal edema, paresthesias of both hands this am.

## 2016-09-08 NOTE — ED Provider Notes (Signed)
[facility]    CSN: [id number] Arrival date & time: 09/08/16  1128     History   Chief Complaint Chief Complaint  Patient presents with  . Insect Bite    HPI Cheryl Saunders is a 71 y.o. female.   71 yo female with a c/o left insect/tick bite 2 weeks ago on her right calf skin, now with continuing pain to the area. Denies any fevers, chills, drainage. States she noticed some slight swelling of her feet but no pain or redness.    The history is provided by the patient.    Past Medical History:  Diagnosis Date  . Diabetes mellitus without complication ([place])   . GERD (gastroesophageal reflux disease)   . Heart murmur    followed by PCP  . Hypertension   . Sleep apnea    supposed to use CPAP. Does not.    Patient Active Problem List   Diagnosis Date Noted  . Special screening for malignant neoplasms, colon   . Benign neoplasm of ascending colon     Past Surgical History:  Procedure Laterality Date  . ABDOMINAL HYSTERECTOMY    . CHOLECYSTECTOMY    . COLONOSCOPY WITH PROPOFOL N/A 10/17/2015   Procedure: COLONOSCOPY WITH PROPOFOL;  Surgeon: [name], MD;  Location: [place];  Service: Endoscopy;  Laterality: N/A;  Diabetic - oral meds Sleep apnea  . HERNIA REPAIR    . POLYPECTOMY  10/17/2015   Procedure: POLYPECTOMY;  Surgeon: [name], MD;  Location: [place];  Service: Endoscopy;;  Ascending colon polyps x 2  . REPLACEMENT TOTAL KNEE Bilateral     OB History    No data available       Home Medications    Prior to Admission medications   Medication Sig Start Date End Date Taking? Authorizing Provider  amLODipine (NORVASC) 10 MG tablet Take 10 mg by mouth daily.   Yes [provider]  aspirin 81 MG tablet Take 81 mg by mouth daily.   Yes [provider]  benazepril (LOTENSIN) 40 MG tablet Take 40 mg by mouth daily.   Yes [provider]  chlorthalidone (HYGROTON) 25 MG tablet Take 25 mg by  mouth daily.   Yes [provider]  Cholecalciferol (VITAMIN D3 PO) Take by mouth daily.   Yes [provider]  glipiZIDE (GLUCOTROL XL) 10 MG 24 hr tablet Take 10 mg by mouth 2 (two) times daily.   Yes [provider]  Liraglutide (VICTOZA) 18 MG/3ML SOPN Inject 1.8 mg into the skin once.   Yes [provider]  lovastatin (MEVACOR) 40 MG tablet Take 40 mg by mouth at bedtime.   Yes [provider]  metFORMIN (GLUCOPHAGE) 1000 MG tablet Take 1,000 mg by mouth 2 (two) times daily with a meal.   Yes [provider]  metoprolol succinate (TOPROL-XL) 50 MG 24 hr tablet Take 50 mg by mouth daily. Take with or immediately following a meal.   Yes [provider]  mupirocin ointment (BACTROBAN) 2 % Apply 1 application topically 2 (two) times daily. 07/11/15  Yes Betancourt, Aura Fey, NP  omeprazole (PRILOSEC) 20 MG capsule Take 20 mg by mouth daily.   Yes [provider]  spironolactone (ALDACTONE) 25 MG tablet Take 25 mg by mouth daily.   Yes [provider]  acetaminophen (TYLENOL) 500 MG tablet Take 1 tablet (500 mg total) by mouth every 6 (six) hours as needed. 07/11/15   Betancourt, Aura Fey,  NP  doxycycline (VIBRA-TABS) 100 MG tablet Take 1 tablet (100 mg total) by mouth 2 (two) times daily. 09/08/16   Norval Gable, MD    Family History Family History  Problem Relation Age of Onset  . Breast cancer Neg Hx     Social History Social History  Substance Use Topics  . Smoking status: Never Smoker  . Smokeless tobacco: Never Used  . Alcohol use No     Allergies   Patient has no known allergies.   Review of Systems Review of Systems   Physical Exam Triage Vital Signs ED Triage Vitals [09/08/16 1144]  Enc Vitals Group     BP (!) 149/66     Pulse Rate 72     Resp 16     Temp 98.9 F (37.2 C)     Temp Source Oral     SpO2 99 %     Weight 185 lb (83.9 kg)     Height 5' (1.524 m)     Head Circumference       Peak Flow      Pain Score      Pain Loc      Pain Edu?      Excl. in [place]?    No data found.   Updated Vital Signs BP (!) 149/66 (BP Location: Left Arm)   Pulse 72   Temp 98.9 F (37.2 C) (Oral)   Resp 16   Ht 5' (1.524 m)   Wt 185 lb (83.9 kg)   SpO2 99%   BMI 36.13 kg/m   Visual Acuity Right Eye Distance:   Left Eye Distance:   Bilateral Distance:    Right Eye Near:   Left Eye Near:    Bilateral Near:     Physical Exam  Constitutional: She appears well-developed and well-nourished. No distress.  Skin: She is not diaphoretic. There is erythema (mild on skin surrounding 61mm superficial skin ulceration on right posterior calf).  Nursing note and vitals reviewed.    UC Treatments / Results  Labs (all labs ordered are listed, but only abnormal results are displayed) Labs Reviewed - No data to display  EKG  EKG Interpretation None       Radiology No results found.  Procedures Procedures (including critical care time)  Medications Ordered in UC Medications - No data to display   Initial Impression / Assessment and Plan / UC Course  I have reviewed the triage vital signs and the nursing notes.  Pertinent labs & imaging results that were available during my care of the patient were reviewed by me and considered in my medical decision making (see chart for details).       Final Clinical Impressions(s) / UC Diagnoses   Final diagnoses:  Insect bite, initial encounter  Cellulitis of right lower extremity    New Prescriptions Discharge Medication List as of 09/08/2016 12:17 PM    START taking these medications   Details  doxycycline (VIBRA-TABS) 100 MG tablet Take 1 tablet (100 mg total) by mouth 2 (two) times daily., Starting Sat 09/08/2016, Normal       1. Lab results and diagnosis reviewed with patient 2. rx as per orders above; reviewed possible side effects, interactions, risks and benefits  3. Recommend supportive treatment with warm  compresses to area 4. Follow-up prn if symptoms worsen or don't improve   Norval Gable, MD 09/08/16 1235

## 2016-10-19 ENCOUNTER — Other Ambulatory Visit: Payer: Self-pay | Admitting: Internal Medicine

## 2016-10-19 DIAGNOSIS — Z1231 Encounter for screening mammogram for malignant neoplasm of breast: Secondary | ICD-10-CM

## 2016-10-31 ENCOUNTER — Ambulatory Visit
Admission: RE | Admit: 2016-10-31 | Discharge: 2016-10-31 | Disposition: A | Discharge status: Home / Self Care | Payer: Medicare Other | Source: Ambulatory Visit | Attending: Internal Medicine | Admitting: Internal Medicine

## 2016-10-31 DIAGNOSIS — R928 Other abnormal and inconclusive findings on diagnostic imaging of breast: Secondary | ICD-10-CM | POA: Diagnosis not present

## 2016-10-31 DIAGNOSIS — Z1231 Encounter for screening mammogram for malignant neoplasm of breast: Secondary | ICD-10-CM | POA: Diagnosis present

## 2016-11-06 ENCOUNTER — Other Ambulatory Visit: Payer: Self-pay | Admitting: Internal Medicine

## 2016-11-06 DIAGNOSIS — R928 Other abnormal and inconclusive findings on diagnostic imaging of breast: Secondary | ICD-10-CM

## 2016-11-06 DIAGNOSIS — R921 Mammographic calcification found on diagnostic imaging of breast: Secondary | ICD-10-CM

## 2016-11-08 ENCOUNTER — Ambulatory Visit
Admission: RE | Admit: 2016-11-08 | Discharge: 2016-11-08 | Disposition: A | Discharge status: Home / Self Care | Payer: Medicare Other | Source: Ambulatory Visit | Attending: Internal Medicine | Admitting: Internal Medicine

## 2016-11-08 DIAGNOSIS — R928 Other abnormal and inconclusive findings on diagnostic imaging of breast: Secondary | ICD-10-CM

## 2016-11-08 DIAGNOSIS — R921 Mammographic calcification found on diagnostic imaging of breast: Secondary | ICD-10-CM | POA: Insufficient documentation

## 2016-11-12 ENCOUNTER — Other Ambulatory Visit: Payer: Self-pay | Admitting: Internal Medicine

## 2016-11-12 DIAGNOSIS — R921 Mammographic calcification found on diagnostic imaging of breast: Secondary | ICD-10-CM

## 2016-11-12 DIAGNOSIS — R928 Other abnormal and inconclusive findings on diagnostic imaging of breast: Secondary | ICD-10-CM

## 2016-11-22 ENCOUNTER — Ambulatory Visit
Admission: RE | Admit: 2016-11-22 | Discharge: 2016-11-22 | Disposition: A | Discharge status: Home / Self Care | Payer: Medicare Other | Source: Ambulatory Visit | Attending: Internal Medicine | Admitting: Internal Medicine

## 2016-11-22 DIAGNOSIS — R928 Other abnormal and inconclusive findings on diagnostic imaging of breast: Secondary | ICD-10-CM | POA: Diagnosis present

## 2016-11-22 DIAGNOSIS — R921 Mammographic calcification found on diagnostic imaging of breast: Secondary | ICD-10-CM

## 2016-11-22 DIAGNOSIS — N62 Hypertrophy of breast: Secondary | ICD-10-CM | POA: Diagnosis not present

## 2016-11-22 HISTORY — PX: BREAST BIOPSY: SHX20

## 2016-11-26 LAB — SURGICAL PATHOLOGY

## 2017-10-25 ENCOUNTER — Other Ambulatory Visit: Payer: Self-pay | Admitting: Internal Medicine

## 2017-10-25 DIAGNOSIS — Z1231 Encounter for screening mammogram for malignant neoplasm of breast: Secondary | ICD-10-CM

## 2017-11-08 ENCOUNTER — Ambulatory Visit
Admission: RE | Admit: 2017-11-08 | Discharge: 2017-11-08 | Disposition: A | Discharge status: Home / Self Care | Payer: Medicare Other | Source: Ambulatory Visit | Attending: Internal Medicine | Admitting: Internal Medicine

## 2017-11-08 DIAGNOSIS — Z1231 Encounter for screening mammogram for malignant neoplasm of breast: Secondary | ICD-10-CM | POA: Diagnosis not present

## 2017-12-18 ENCOUNTER — Encounter: Payer: Self-pay | Admitting: Gastroenterology

## 2017-12-18 ENCOUNTER — Other Ambulatory Visit: Payer: Self-pay

## 2017-12-18 ENCOUNTER — Encounter (INDEPENDENT_AMBULATORY_CARE_PROVIDER_SITE_OTHER): Payer: Self-pay

## 2017-12-18 ENCOUNTER — Ambulatory Visit (INDEPENDENT_AMBULATORY_CARE_PROVIDER_SITE_OTHER): Payer: Medicare Other | Admitting: Gastroenterology

## 2017-12-18 DIAGNOSIS — I10 Essential (primary) hypertension: Secondary | ICD-10-CM | POA: Insufficient documentation

## 2017-12-18 DIAGNOSIS — K219 Gastro-esophageal reflux disease without esophagitis: Secondary | ICD-10-CM | POA: Diagnosis not present

## 2017-12-18 DIAGNOSIS — G4733 Obstructive sleep apnea (adult) (pediatric): Secondary | ICD-10-CM | POA: Insufficient documentation

## 2017-12-18 DIAGNOSIS — E1159 Type 2 diabetes mellitus with other circulatory complications: Secondary | ICD-10-CM | POA: Insufficient documentation

## 2017-12-18 DIAGNOSIS — E119 Type 2 diabetes mellitus without complications: Secondary | ICD-10-CM | POA: Insufficient documentation

## 2017-12-18 NOTE — Progress Notes (Signed)
Primary Care Physician: Casilda Carls, MD  Primary Gastroenterologist:  Dr. Lucilla Saunders  No chief complaint on file.   HPI: Cheryl Saunders is a 72 y.o. female here for reflux and possible colonoscopy.  The patient had a referral sent by her primary care provider for reflux and for a colonoscopy.  The patient appears to have had a colonoscopy in August 2017 with 2 polyps both were within 2-75mm in size.  It was recommended that the patient have a repeat colonoscopy in 5 years.  The patient was sent a letter to that effect. The patient now reports that she did not remember when her next colonoscopy was due and she also states that she has heartburn when she does not eat well. She reports that when she takes her omeprazole her symptoms go away.  The patient has been taking the omeprazole every other day because she has symptoms on average every other day.  Current Outpatient Medications  Medication Sig Dispense Refill  . acetaminophen (TYLENOL) 500 MG tablet Take 1 tablet (500 mg total) by mouth every 6 (six) hours as needed. 30 tablet 0  . amLODipine (NORVASC) 10 MG tablet Take 10 mg by mouth daily.    Marland Kitchen aspirin 81 MG tablet Take 81 mg by mouth daily.    Marland Kitchen b complex vitamins tablet Take by mouth.    . benazepril (LOTENSIN) 40 MG tablet Take 40 mg by mouth daily.    . chlorthalidone (HYGROTON) 25 MG tablet Take 25 mg by mouth daily.    . Cholecalciferol (VITAMIN D3 PO) Take by mouth daily.    Marland Kitchen doxycycline (VIBRA-TABS) 100 MG tablet Take 1 tablet (100 mg total) by mouth 2 (two) times daily. 14 tablet 0  . glipiZIDE (GLUCOTROL XL) 10 MG 24 hr tablet Take 10 mg by mouth 2 (two) times daily.    . Liraglutide (VICTOZA) 18 MG/3ML SOPN Inject 1.8 mg into the skin once.    . lovastatin (MEVACOR) 40 MG tablet Take 40 mg by mouth at bedtime.    . metFORMIN (GLUCOPHAGE) 1000 MG tablet Take 1,000 mg by mouth 2 (two) times daily with a meal.    . metoprolol succinate (TOPROL-XL) 50 MG 24 hr tablet  Take 50 mg by mouth daily. Take with or immediately following a meal.    . mupirocin ointment (BACTROBAN) 2 % Apply 1 application topically 2 (two) times daily. 22 g 0  . omeprazole (PRILOSEC OTC) 20 MG tablet Take by mouth.    Marland Kitchen omeprazole (PRILOSEC) 20 MG capsule Take 20 mg by mouth daily.    Marland Kitchen spironolactone (ALDACTONE) 25 MG tablet Take 25 mg by mouth daily.    . Vitamin D, Ergocalciferol, (DRISDOL) 50000 units CAPS capsule Take 50,000 Units by mouth once a week.  1   No current facility-administered medications for this visit.     Allergies as of 12/18/2017  . (No Known Allergies)    ROS:  General: Negative for anorexia, weight loss, fever, chills, fatigue, weakness. ENT: Negative for hoarseness, difficulty swallowing , nasal congestion. CV: Negative for chest pain, angina, palpitations, dyspnea on exertion, peripheral edema.  Respiratory: Negative for dyspnea at rest, dyspnea on exertion, cough, sputum, wheezing.  GI: See history of present illness. GU:  Negative for dysuria, hematuria, urinary incontinence, urinary frequency, nocturnal urination.  Endo: Negative for unusual weight change.    Physical Examination:   There were no vitals taken for this visit.  General: Well-nourished, well-developed in no acute distress.  Eyes: No icterus. Conjunctivae pink. Mouth: Oropharyngeal mucosa moist and pink , no lesions erythema or exudate. Lungs: Clear to auscultation bilaterally. Non-labored. Heart: Regular rate and rhythm, no murmurs rubs or gallops.  Abdomen: Bowel sounds are normal, nontender, nondistended, no hepatosplenomegaly or masses, no abdominal bruits or hernia , no rebound or guarding.   Extremities: No lower extremity edema. No clubbing or deformities. Neuro: Alert and oriented x 3.  Grossly intact. Skin: Warm and dry, no jaundice.   Psych: Alert and cooperative, normal mood and affect.  Labs:    Imaging Studies: No results found.  Assessment and Plan:    Cheryl Saunders is a 72 y.o. y/o female who comes in today with a history of polyps and is not due for another colonoscopy for 3 years.  The patient has also had reflux symptoms and she takes her omeprazole intermittently.  The patient is having more than 3 episodes of reflux per week and has been told to take the omeprazole every day.  She denies any dysphasia black stools or bloody stools.  The patient does not need any upper endoscopy at this time or colonoscopy.  The patient is happy to hear that and will follow-up as needed for her upper GI symptoms and will have a repeat colonoscopy in 3 years.    Cheryl Lame, MD. Marval Regal   Note: This dictation was prepared with Dragon dictation along with smaller phrase technology. Any transcriptional errors that result from this process are unintentional.

## 2018-09-28 IMAGING — CR DG CERVICAL SPINE COMPLETE 4+V
1 series · 6 of 6 positions shown · non-contrast
Comparison: None.

CLINICAL DATA: Left-sided neck pain radiating down left upper
extremity for 1 month

EXAM:
CERVICAL SPINE - COMPLETE 4+ VIEW

[Series 1: dg cervical spine complete · 0.14mm/px · 6 of 6 slices shown]
[im 1/6]
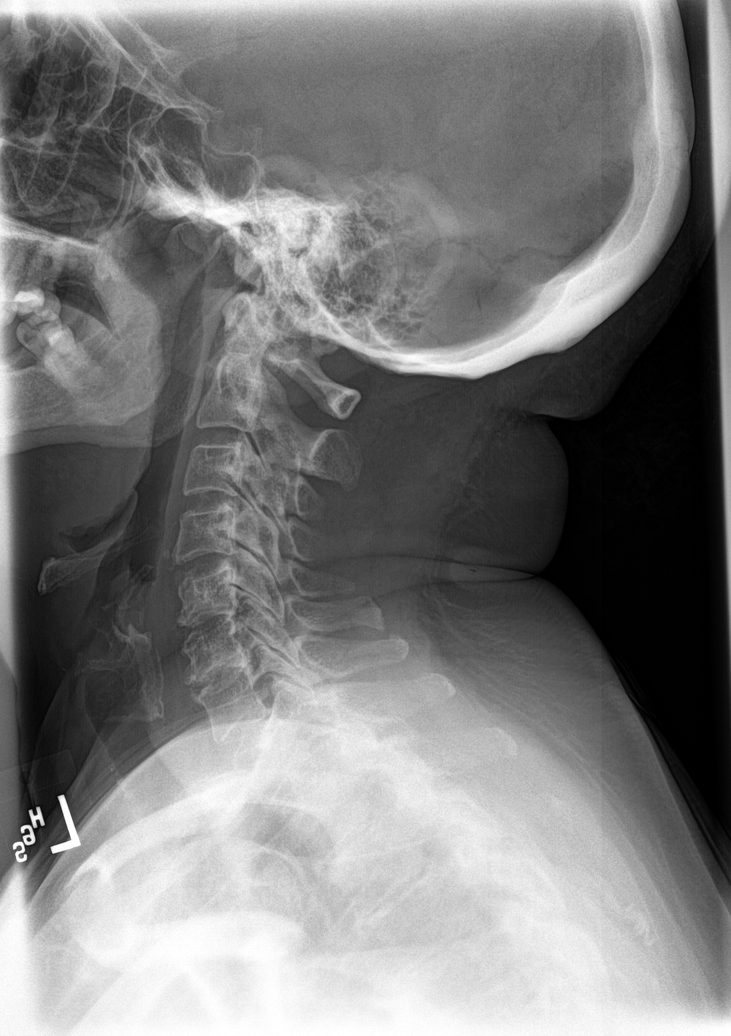
[im 2/6]
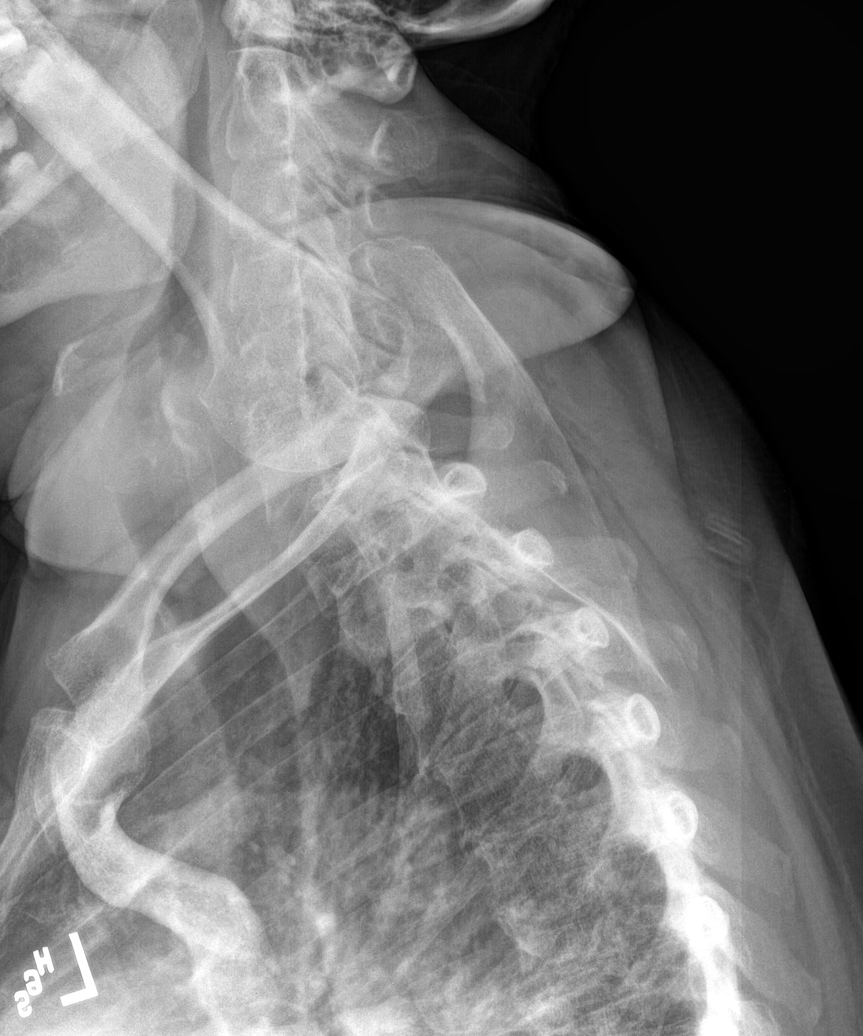
[im 3/6]
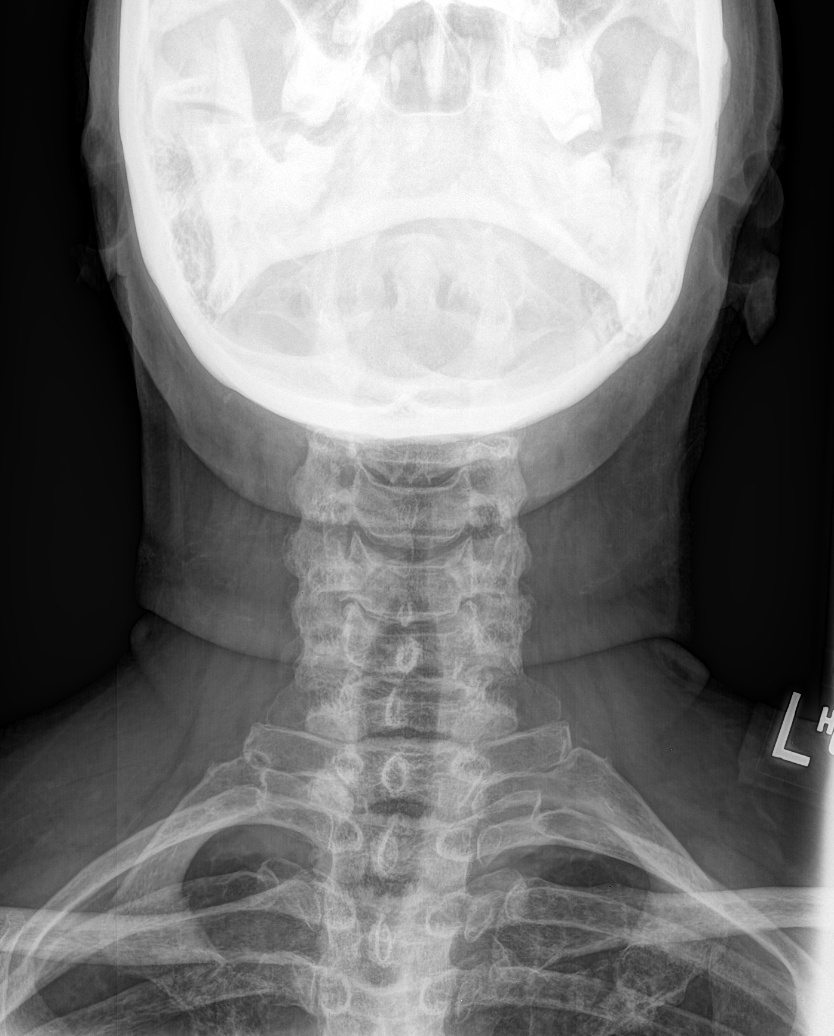
[im 4/6]
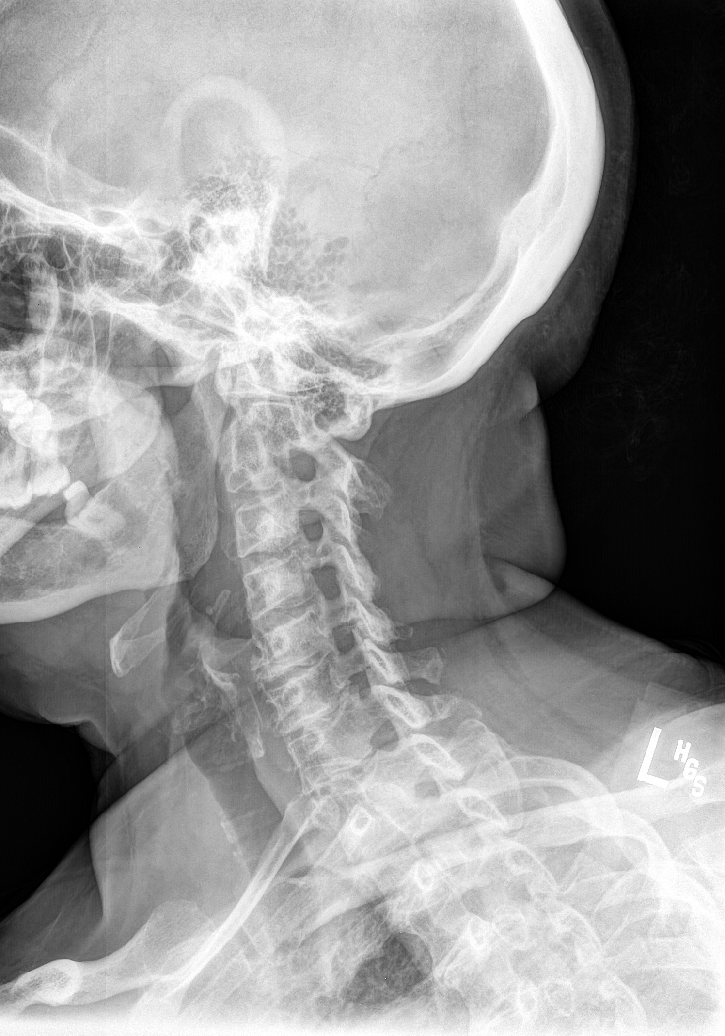
[im 5/6]
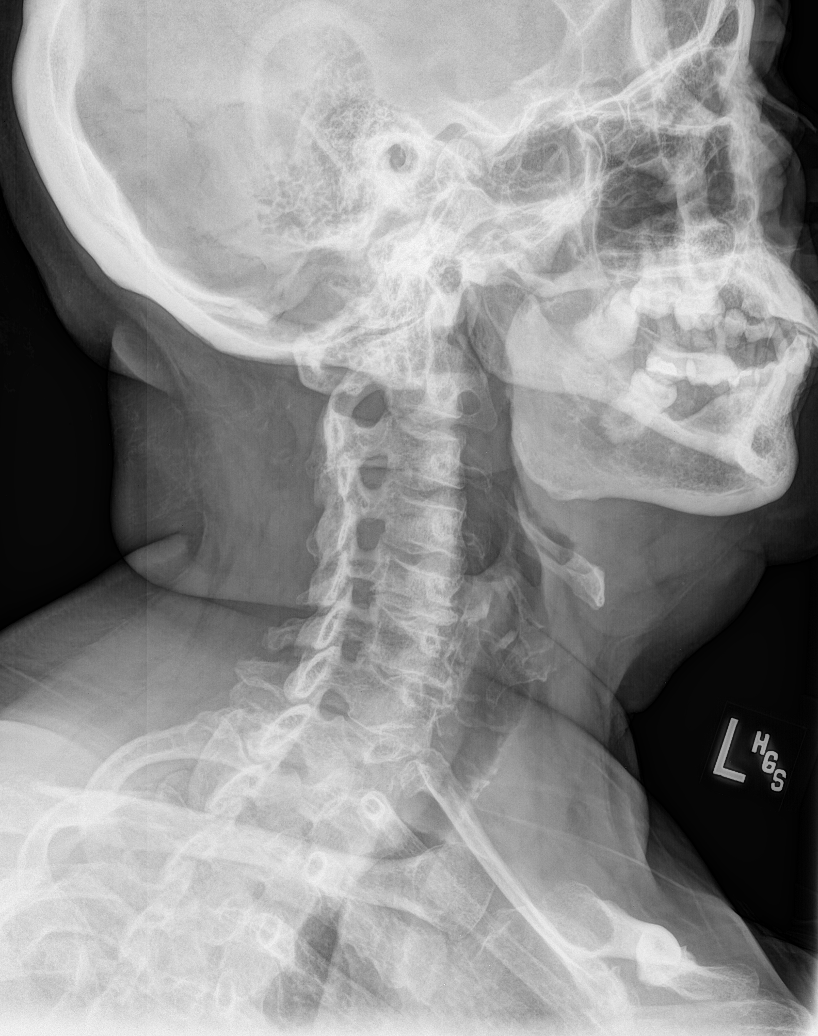
[im 6/6]
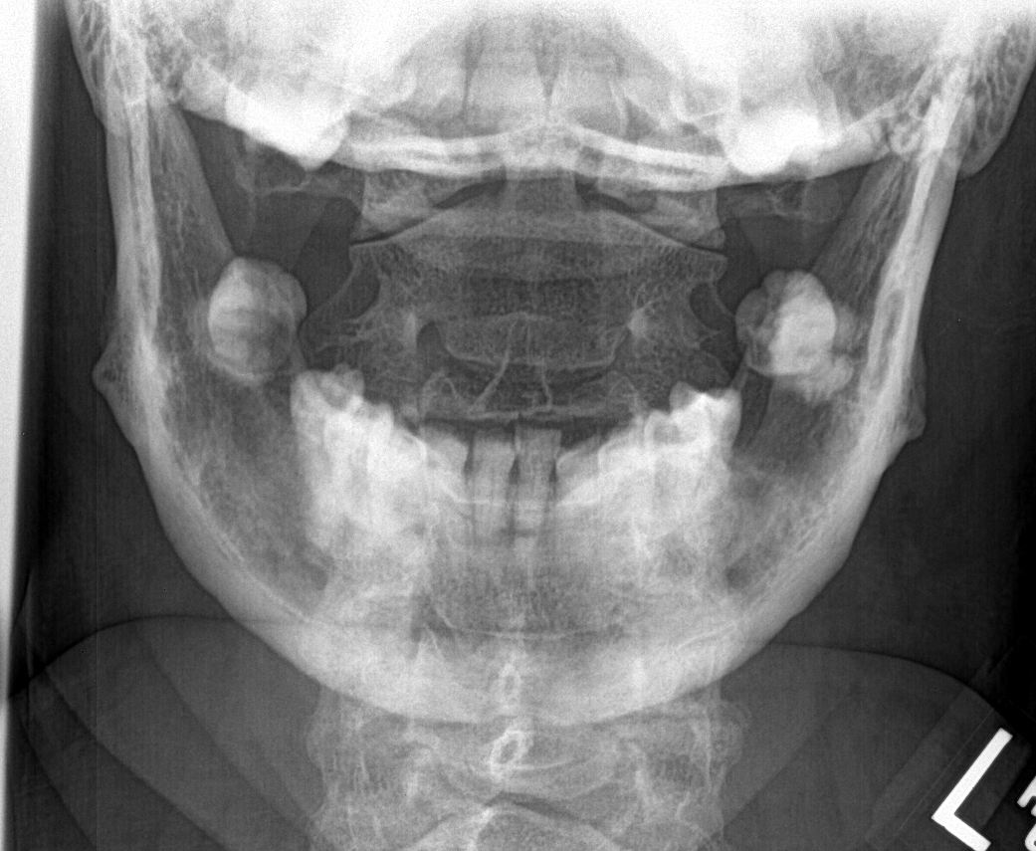

[6 of 6 positions shown; findings below may reference images not displayed]

FINDINGS: There is no evidence of cervical spine fracture or prevertebral soft
tissue swelling. Alignment is normal. Slight disc space narrowing at
C5-6 and C6-7. Mild uncovertebral spurring at C5-6 bilaterally and
mild-to-moderate on the left at C6-7 contributing to mild neural
foraminal encroachment. Multilevel facet arthropathy with mild
hypertrophy and sclerosis bilaterally.
IMPRESSION: C5-6 and C6-7 degenerative disc space narrowing with mild neural
foraminal narrowing bilaterally at C5-6 and mild-to-moderate on the
left at C6-7. No acute osseous abnormality.

## 2018-11-04 ENCOUNTER — Other Ambulatory Visit: Payer: Self-pay | Admitting: Internal Medicine

## 2018-11-04 DIAGNOSIS — Z1231 Encounter for screening mammogram for malignant neoplasm of breast: Secondary | ICD-10-CM

## 2018-12-10 ENCOUNTER — Ambulatory Visit
Admission: RE | Admit: 2018-12-10 | Discharge: 2018-12-10 | Disposition: A | Discharge status: Home / Self Care | Payer: Medicare HMO | Source: Ambulatory Visit | Attending: Internal Medicine | Admitting: Internal Medicine

## 2018-12-10 DIAGNOSIS — Z1231 Encounter for screening mammogram for malignant neoplasm of breast: Secondary | ICD-10-CM | POA: Diagnosis present

## 2019-10-27 ENCOUNTER — Other Ambulatory Visit: Payer: Self-pay | Admitting: Internal Medicine

## 2019-10-27 DIAGNOSIS — Z1231 Encounter for screening mammogram for malignant neoplasm of breast: Secondary | ICD-10-CM

## 2019-12-11 ENCOUNTER — Other Ambulatory Visit: Payer: Self-pay

## 2019-12-11 ENCOUNTER — Ambulatory Visit
Admission: RE | Admit: 2019-12-11 | Discharge: 2019-12-11 | Disposition: A | Discharge status: Home / Self Care | Payer: Medicare HMO | Source: Ambulatory Visit | Attending: Internal Medicine | Admitting: Internal Medicine

## 2019-12-11 DIAGNOSIS — Z1231 Encounter for screening mammogram for malignant neoplasm of breast: Secondary | ICD-10-CM | POA: Insufficient documentation

## 2020-02-27 HISTORY — PX: CATARACT EXTRACTION W/ INTRAOCULAR LENS  IMPLANT, BILATERAL: SHX1307

## 2020-06-17 ENCOUNTER — Other Ambulatory Visit: Payer: Self-pay

## 2020-08-04 ENCOUNTER — Other Ambulatory Visit: Payer: Self-pay | Admitting: Internal Medicine

## 2020-08-04 DIAGNOSIS — Z1231 Encounter for screening mammogram for malignant neoplasm of breast: Secondary | ICD-10-CM

## 2020-08-31 ENCOUNTER — Other Ambulatory Visit: Payer: Self-pay

## 2020-08-31 DIAGNOSIS — Z1211 Encounter for screening for malignant neoplasm of colon: Secondary | ICD-10-CM

## 2020-08-31 MED ORDER — CLENPIQ 10-3.5-12 MG-GM -GM/160ML PO SOLN: 320.0000 mL | ORAL | 0 refills | Status: DC

## 2020-09-06 ENCOUNTER — Other Ambulatory Visit: Payer: Self-pay

## 2020-09-22 ENCOUNTER — Encounter: Payer: Self-pay | Admitting: Gastroenterology

## 2020-09-26 ENCOUNTER — Telehealth: Payer: Self-pay

## 2020-09-26 NOTE — Telephone Encounter (Signed)
Informed patient prep was sent on 08/31/20. Called pharmacy to verify prep was still there and she confirmed it was. Patient verbalized understanding.

## 2020-10-01 NOTE — Anesthesia Preprocedure Evaluation (Addendum)
Anesthesia Evaluation  Patient identified by MRN, date of birth, ID band Patient awake    Reviewed: Allergy & Precautions, NPO status , Patient's Chart, lab work & pertinent test results  History of Anesthesia Complications Negative for: history of anesthetic complications  Airway Mallampati: IV   Neck ROM: Full    Dental   Missing few molars:   Pulmonary sleep apnea ,    Pulmonary exam normal breath sounds clear to auscultation       Cardiovascular hypertension, Normal cardiovascular exam Rhythm:Regular Rate:Normal     Neuro/Psych negative neurological ROS     GI/Hepatic GERD  ,  Endo/Other  diabetes, Type 2  Renal/GU Renal disease (stage III CKD)     Musculoskeletal   Abdominal   Peds  Hematology negative hematology ROS (+)   Anesthesia Other Findings   Reproductive/Obstetrics                            Anesthesia Physical Anesthesia Plan  ASA: 2  Anesthesia Plan: General   Post-op Pain Management:    Induction:   PONV Risk Score and Plan: 3 and Propofol infusion, TIVA and Treatment may vary due to age or medical condition  Airway Management Planned: Natural Airway  Additional Equipment:   Intra-op Plan:   Post-operative Plan:   Informed Consent: I have reviewed the patients History and Physical, chart, labs and discussed the procedure including the risks, benefits and alternatives for the proposed anesthesia with the patient or authorized representative who has indicated his/her understanding and acceptance.       Plan Discussed with: CRNA  Anesthesia Plan Comments:        Anesthesia Quick Evaluation

## 2020-10-03 ENCOUNTER — Ambulatory Visit: Payer: Medicare HMO | Admitting: Anesthesiology

## 2020-10-03 ENCOUNTER — Encounter
Admission: RE | Disposition: A | Discharge status: Home / Self Care | Payer: Self-pay | Source: Home / Self Care | Attending: Gastroenterology

## 2020-10-03 ENCOUNTER — Encounter: Payer: Self-pay | Admitting: Gastroenterology

## 2020-10-03 ENCOUNTER — Ambulatory Visit
Admission: RE | Admit: 2020-10-03 | Discharge: 2020-10-03 | Disposition: A | Discharge status: Home / Self Care | Payer: Medicare HMO | Attending: Gastroenterology | Admitting: Gastroenterology

## 2020-10-03 ENCOUNTER — Other Ambulatory Visit: Payer: Self-pay

## 2020-10-03 DIAGNOSIS — Z7984 Long term (current) use of oral hypoglycemic drugs: Secondary | ICD-10-CM | POA: Diagnosis not present

## 2020-10-03 DIAGNOSIS — Z9049 Acquired absence of other specified parts of digestive tract: Secondary | ICD-10-CM | POA: Insufficient documentation

## 2020-10-03 DIAGNOSIS — K635 Polyp of colon: Secondary | ICD-10-CM | POA: Diagnosis not present

## 2020-10-03 DIAGNOSIS — D122 Benign neoplasm of ascending colon: Secondary | ICD-10-CM | POA: Diagnosis not present

## 2020-10-03 DIAGNOSIS — Z860100 Personal history of colon polyps, unspecified: Secondary | ICD-10-CM

## 2020-10-03 DIAGNOSIS — E119 Type 2 diabetes mellitus without complications: Secondary | ICD-10-CM | POA: Diagnosis not present

## 2020-10-03 DIAGNOSIS — I10 Essential (primary) hypertension: Secondary | ICD-10-CM | POA: Insufficient documentation

## 2020-10-03 DIAGNOSIS — K219 Gastro-esophageal reflux disease without esophagitis: Secondary | ICD-10-CM | POA: Insufficient documentation

## 2020-10-03 DIAGNOSIS — G4730 Sleep apnea, unspecified: Secondary | ICD-10-CM | POA: Insufficient documentation

## 2020-10-03 DIAGNOSIS — Z90710 Acquired absence of both cervix and uterus: Secondary | ICD-10-CM | POA: Insufficient documentation

## 2020-10-03 DIAGNOSIS — Z7982 Long term (current) use of aspirin: Secondary | ICD-10-CM | POA: Insufficient documentation

## 2020-10-03 DIAGNOSIS — Z96653 Presence of artificial knee joint, bilateral: Secondary | ICD-10-CM | POA: Insufficient documentation

## 2020-10-03 DIAGNOSIS — Z1211 Encounter for screening for malignant neoplasm of colon: Secondary | ICD-10-CM | POA: Diagnosis not present

## 2020-10-03 DIAGNOSIS — Z79899 Other long term (current) drug therapy: Secondary | ICD-10-CM | POA: Diagnosis not present

## 2020-10-03 DIAGNOSIS — K5730 Diverticulosis of large intestine without perforation or abscess without bleeding: Secondary | ICD-10-CM | POA: Insufficient documentation

## 2020-10-03 HISTORY — PX: COLONOSCOPY WITH PROPOFOL: SHX5780

## 2020-10-03 HISTORY — PX: POLYPECTOMY: SHX5525

## 2020-10-03 LAB — GLUCOSE, CAPILLARY: Glucose-Capillary: 94 mg/dL (ref 70–99)

## 2020-10-03 SURGERY — COLONOSCOPY WITH PROPOFOL
Anesthesia: General

## 2020-10-03 MED ORDER — ACETAMINOPHEN 160 MG/5ML PO SOLN: 325.0000 mg | ORAL | Status: DC | PRN

## 2020-10-03 MED ORDER — LACTATED RINGERS IV SOLN: INTRAVENOUS | Status: DC

## 2020-10-03 MED ORDER — ACETAMINOPHEN 325 MG PO TABS: 650.0000 mg | ORAL_TABLET | Freq: Once | ORAL | Status: DC | PRN

## 2020-10-03 MED ORDER — PROPOFOL 10 MG/ML IV BOLUS
INTRAVENOUS | Status: DC | PRN
  Administered 2020-10-03: 20 mg via INTRAVENOUS
  Administered 2020-10-03: 120 mg via INTRAVENOUS
  Administered 2020-10-03: 40 mg via INTRAVENOUS
  Administered 2020-10-03 (×2): 20 mg via INTRAVENOUS

## 2020-10-03 MED ORDER — STERILE WATER FOR IRRIGATION IR SOLN: Status: DC | PRN

## 2020-10-03 MED ORDER — ONDANSETRON HCL 4 MG/2ML IJ SOLN: 4.0000 mg | Freq: Once | INTRAMUSCULAR | Status: DC | PRN

## 2020-10-03 MED ORDER — LIDOCAINE HCL (CARDIAC) PF 100 MG/5ML IV SOSY
PREFILLED_SYRINGE | INTRAVENOUS | Status: DC | PRN
  Administered 2020-10-03: 30 mg via INTRAVENOUS

## 2020-10-03 MED ORDER — SODIUM CHLORIDE 0.9 % IV SOLN: INTRAVENOUS | Status: DC

## 2020-10-03 SURGICAL SUPPLY — 22 items
CLIP HMST 235XBRD CATH ROT (MISCELLANEOUS) IMPLANT
CLIP RESOLUTION 360 11X235 (MISCELLANEOUS)
ELECT REM PT RETURN 9FT ADLT (ELECTROSURGICAL)
ELECTRODE REM PT RTRN 9FT ADLT (ELECTROSURGICAL) IMPLANT
FORCEPS BIOP RAD 4 LRG CAP 4 (CUTTING FORCEPS) IMPLANT
GOWN CVR UNV OPN BCK APRN NK (MISCELLANEOUS) ×4 IMPLANT
GOWN ISOL THUMB LOOP REG UNIV (MISCELLANEOUS) ×6
INJECTOR VARIJECT VIN23 (MISCELLANEOUS) IMPLANT
KIT DEFENDO VALVE AND CONN (KITS) IMPLANT
KIT PRC NS LF DISP ENDO (KITS) ×2 IMPLANT
KIT PROCEDURE OLYMPUS (KITS) ×3
MANIFOLD NEPTUNE II (INSTRUMENTS) ×3 IMPLANT
MARKER SPOT ENDO TATTOO 5ML (MISCELLANEOUS) IMPLANT
PROBE APC STR FIRE (PROBE) IMPLANT
RETRIEVER NET ROTH 2.5X230 LF (MISCELLANEOUS) IMPLANT
SNARE COLD EXACTO (MISCELLANEOUS) IMPLANT
SNARE SHORT THROW 13M SML OVAL (MISCELLANEOUS) ×3 IMPLANT
SNARE SNG USE RND 15MM (INSTRUMENTS) IMPLANT
SPOT EX ENDOSCOPIC TATTOO (MISCELLANEOUS)
TRAP ETRAP POLY (MISCELLANEOUS) ×3 IMPLANT
VARIJECT INJECTOR VIN23 (MISCELLANEOUS)
WATER STERILE IRR 250ML POUR (IV SOLUTION) ×3 IMPLANT

## 2020-10-03 NOTE — H&P (Signed)
[name], MD [address] [place], [place] [address] Phone:[phone] Fax : [phone]  Primary Care Physician:  Casilda Carls, MD Primary Gastroenterologist:  Dr. Allen Norris  Pre-Procedure History & Physical: HPI:  Cheryl Saunders is a 75 y.o. female is here for an colonoscopy.   Past Medical History:  Diagnosis Date   Diabetes mellitus without complication (HCC)    GERD (gastroesophageal reflux disease)    Heart murmur    followed by PCP   Hypertension    Sleep apnea    supposed to use CPAP. Does not.    Past Surgical History:  Procedure Laterality Date   ABDOMINAL HYSTERECTOMY     BREAST BIOPSY Right 11/22/2016    coil clip - PASH   CATARACT EXTRACTION W/ INTRAOCULAR LENS  IMPLANT, BILATERAL  2022   CHOLECYSTECTOMY     COLONOSCOPY WITH PROPOFOL N/A 10/17/2015   Procedure: COLONOSCOPY WITH PROPOFOL;  Surgeon: [name], MD;  Location: [facility];  Service: Endoscopy;  Laterality: N/A;  Diabetic - oral meds Sleep apnea   HERNIA REPAIR     POLYPECTOMY  10/17/2015   Procedure: POLYPECTOMY;  Surgeon: [name], MD;  Location: [facility];  Service: Endoscopy;;  Ascending colon polyps x 2   REPLACEMENT TOTAL KNEE Bilateral     Prior to Admission medications   Medication Sig Start Date End Date Taking? Authorizing Provider  amLODipine (NORVASC) 10 MG tablet Take 10 mg by mouth daily.   Yes [provider]  aspirin 81 MG tablet Take 81 mg by mouth daily.   Yes [provider]  benazepril (LOTENSIN) 40 MG tablet Take 40 mg by mouth daily.   Yes [provider]  chlorthalidone (HYGROTON) 25 MG tablet Take 25 mg by mouth daily.   Yes [provider]  glipiZIDE (GLUCOTROL XL) 10 MG 24 hr tablet Take 10 mg by mouth 2 (two) times daily.   Yes [provider]  lovastatin (MEVACOR) 40 MG tablet Take 40 mg by mouth at bedtime.   Yes [provider]  metFORMIN (GLUCOPHAGE) 1000 MG tablet Take  1,000 mg by mouth 2 (two) times daily with a meal.   Yes [provider]  metoprolol succinate (TOPROL-XL) 50 MG 24 hr tablet Take 50 mg by mouth daily. Take with or immediately following a meal.   Yes [provider]  Multiple Vitamin (MULTIVITAMIN) tablet Take 1 tablet by mouth daily.   Yes [provider]  omeprazole (PRILOSEC) 20 MG capsule Take 20 mg by mouth daily.   Yes [provider]  Semaglutide (OZEMPIC, 1 MG/DOSE, [place]) Inject 2.5 mg into the skin once a week.   Yes [provider]  acetaminophen (TYLENOL) 500 MG tablet Take 1 tablet (500 mg total) by mouth every 6 (six) hours as needed. 07/11/15   Betancourt, Aura Fey, NP  doxycycline (VIBRA-TABS) 100 MG tablet Take 1 tablet (100 mg total) by mouth 2 (two) times daily. 09/08/16   Norval Gable, MD  liraglutide (VICTOZA) 18 MG/3ML SOPN Inject 1.8 mg into the skin once. Patient not taking: Reported on 09/22/2020    [provider]  mupirocin ointment (BACTROBAN) 2 % Apply 1 application topically 2 (two) times daily. 07/11/15   Betancourt, Aura Fey, NP  Sod Picosulfate-Mag Ox-Cit Acd (CLENPIQ) 10-3.5-12 MG-GM -GM/160ML SOLN Take 320 mLs by mouth as directed. 08/31/20   [name], MD  spironolactone (ALDACTONE) 25 MG tablet Take 25 mg by mouth daily. Patient not taking: Reported  on 09/22/2020    [provider]  Vitamin D, Ergocalciferol, (DRISDOL) 50000 units CAPS capsule Take 50,000 Units by mouth once a week. Patient not taking: Reported on 09/22/2020 11/20/17   [provider]    Allergies as of 08/31/2020   (No Known Allergies)    Family History  Problem Relation Age of Onset   Breast cancer Neg Hx     Social History   Socioeconomic History   Marital status: Married    Spouse name: Not on file   Number of children: Not on file   Years of education: Not on file   Highest education level: Not on file  Occupational History   Not on file  Tobacco Use   Smoking  status: Never   Smokeless tobacco: Never  Vaping Use   Vaping Use: Never used  Substance and Sexual Activity   Alcohol use: No   Drug use: No   Sexual activity: Not on file  Other Topics Concern   Not on file  Social History Narrative   Not on file   Social Determinants of Health   Financial Resource Strain: Not on file  Food Insecurity: Not on file  Transportation Needs: Not on file  Physical Activity: Not on file  Stress: Not on file  Social Connections: Not on file  Intimate Partner Violence: Not on file    Review of Systems: See HPI, otherwise negative ROS  Physical Exam: Ht 5' (1.524 m)   Wt 83.9 kg   BMI 36.13 kg/m  General:   Alert,  pleasant and cooperative in NAD Head:  Normocephalic and atraumatic. Neck:  Supple; no masses or thyromegaly. Lungs:  Clear throughout to auscultation.    Heart:  Regular rate and rhythm. Abdomen:  Soft, nontender and nondistended. Normal bowel sounds, without guarding, and without rebound.   Neurologic:  Alert and  oriented x4;  grossly normal neurologically.  Impression/Plan: Cheryl Saunders is here for an colonoscopy to be performed for a history of adenomatous polyps on 2017   Risks, benefits, limitations, and alternatives regarding  colonoscopy have been reviewed with the patient.  Questions have been answered.  All parties agreeable.   [name], MD  10/03/2020, 7:49 AM

## 2020-10-03 NOTE — Op Note (Signed)
[facility] Patient Name: Cheryl Saunders Procedure Date: 10/03/2020 8:17 AM MRN: [id number] Account #: [id number] Date of Birth: [birth year] Admit Type: Outpatient Age: 75 Room: [facility] OR ROOM 01 Gender: Female Note Status: Finalized Procedure:             Colonoscopy Indications:           High risk colon cancer surveillance: Personal history                         of colonic polyps Providers:             [name] MD, MD Referring MD:          Casilda Carls, MD (Referring MD) Medicines:             Propofol per Anesthesia Complications:         No immediate complications. Procedure:             Pre-Anesthesia Assessment:                        - Prior to the procedure, a History and Physical was                         performed, and patient medications and allergies were                         reviewed. The patient's tolerance of previous                         anesthesia was also reviewed. The risks and benefits                         of the procedure and the sedation options and risks                         were discussed with the patient. All questions were                         answered, and informed consent was obtained. Prior                         Anticoagulants: The patient has taken no previous                         anticoagulant or antiplatelet agents. ASA Grade                         Assessment: II - A patient with mild systemic disease.                         After reviewing the risks and benefits, the patient                         was deemed in satisfactory condition to undergo the                         procedure.  After obtaining informed consent, the colonoscope was                         passed under direct vision. Throughout the procedure,                         the patient's blood pressure, pulse, and oxygen                         saturations were monitored continuously. The was                          introduced through the anus and advanced to the the                         cecum, identified by appendiceal orifice and ileocecal                         valve. The colonoscopy was performed without                         difficulty. The patient tolerated the procedure well.                         The quality of the bowel preparation was excellent. Findings:      The perianal and digital rectal examinations were normal.      A 6 mm polyp was found in the ascending colon. The polyp was sessile.       The polyp was removed with a cold snare. Resection and retrieval were       complete.      Many small-mouthed diverticula were found in the entire colon. Impression:            - One 6 mm polyp in the ascending colon, removed with                         a cold snare. Resected and retrieved.                        - Diverticulosis in the entire examined colon. Recommendation:        - Discharge patient to home.                        - Resume previous diet.                        - Continue present medications.                        - Await pathology results.                        - Repeat colonoscopy is not recommended for                         surveillance. Procedure Code(s):     --- Professional ---                        [phone], Colonoscopy, flexible; with removal of  tumor(s), polyp(s), or other lesion(s) by snare                         technique Diagnosis Code(s):     --- Professional ---                        Z86.010, Personal history of colonic polyps                        K63.5, Polyp of colon CPT copyright 2019 [employer]. All rights reserved. The codes documented in this report are preliminary and upon coder review may  be revised to meet current compliance requirements. [name] MD, MD 10/03/2020 8:44:26 AM This report has been signed electronically. Number of Addenda: 0 Note Initiated On: 10/03/2020 8:17 AM Scope  Withdrawal Time: 0 hours 10 minutes 10 seconds  Total Procedure Duration: 0 hours 15 minutes 25 seconds  Estimated Blood Loss:  Estimated blood loss: none.      [facility]

## 2020-10-03 NOTE — Anesthesia Procedure Notes (Signed)
Date/Time: 10/03/2020 8:22 AM Performed by: Cameron Ali, CRNA Pre-anesthesia Checklist: Patient identified, Emergency Drugs available, Suction available, Timeout performed and Patient being monitored Patient Re-evaluated:Patient Re-evaluated prior to induction Oxygen Delivery Method: Nasal cannula Placement Confirmation: positive ETCO2

## 2020-10-03 NOTE — Transfer of Care (Signed)
Immediate Anesthesia Transfer of Care Note  Patient: Cheryl Saunders  Procedure(s) Performed: COLONOSCOPY WITH PROPOFOL POLYPECTOMY  Patient Location: PACU  Anesthesia Type: General  Level of Consciousness: awake, alert  and patient cooperative  Airway and Oxygen Therapy: Patient Spontanous Breathing and Patient connected to supplemental oxygen  Post-op Assessment: Post-op Vital signs reviewed, Patient's Cardiovascular Status Stable, Respiratory Function Stable, Patent Airway and No signs of Nausea or vomiting  Post-op Vital Signs: Reviewed and stable  Complications: No notable events documented.

## 2020-10-03 NOTE — Anesthesia Postprocedure Evaluation (Signed)
Anesthesia Post Note  Patient: Cheryl Saunders  Procedure(s) Performed: COLONOSCOPY WITH PROPOFOL POLYPECTOMY     Patient location during evaluation: PACU Anesthesia Type: General Level of consciousness: awake and alert, oriented and patient cooperative Pain management: pain level controlled Vital Signs Assessment: post-procedure vital signs reviewed and stable Respiratory status: spontaneous breathing, nonlabored ventilation and respiratory function stable Cardiovascular status: blood pressure returned to baseline and stable Postop Assessment: adequate PO intake Anesthetic complications: no   No notable events documented.  Darrin Nipper

## 2020-10-04 ENCOUNTER — Encounter: Payer: Self-pay | Admitting: Gastroenterology

## 2020-10-05 LAB — SURGICAL PATHOLOGY

## 2020-10-07 ENCOUNTER — Encounter: Payer: Self-pay | Admitting: Gastroenterology

## 2020-12-13 ENCOUNTER — Other Ambulatory Visit: Payer: Self-pay

## 2020-12-13 ENCOUNTER — Ambulatory Visit
Admission: RE | Admit: 2020-12-13 | Discharge: 2020-12-13 | Disposition: A | Discharge status: Home / Self Care | Payer: Medicare HMO | Source: Ambulatory Visit | Attending: Internal Medicine | Admitting: Internal Medicine

## 2020-12-13 DIAGNOSIS — Z1231 Encounter for screening mammogram for malignant neoplasm of breast: Secondary | ICD-10-CM | POA: Insufficient documentation

## 2021-01-30 ENCOUNTER — Ambulatory Visit
Admission: EM | Admit: 2021-01-30 | Discharge: 2021-01-30 | Disposition: A | Discharge status: Home / Self Care | Payer: Medicare HMO | Attending: Physician Assistant | Admitting: Physician Assistant

## 2021-01-30 ENCOUNTER — Other Ambulatory Visit: Payer: Self-pay

## 2021-01-30 ENCOUNTER — Encounter: Payer: Self-pay | Admitting: Emergency Medicine

## 2021-01-30 DIAGNOSIS — R1011 Right upper quadrant pain: Secondary | ICD-10-CM | POA: Insufficient documentation

## 2021-01-30 DIAGNOSIS — M546 Pain in thoracic spine: Secondary | ICD-10-CM | POA: Insufficient documentation

## 2021-01-30 DIAGNOSIS — R252 Cramp and spasm: Secondary | ICD-10-CM | POA: Insufficient documentation

## 2021-01-30 LAB — URINALYSIS, COMPLETE (UACMP) WITH MICROSCOPIC
Bilirubin Urine: NEGATIVE
Glucose, UA: NEGATIVE mg/dL
Hgb urine dipstick: NEGATIVE
Ketones, ur: NEGATIVE mg/dL
Leukocytes,Ua: NEGATIVE
Nitrite: NEGATIVE
Protein, ur: >300 mg/dL — AB
Specific Gravity, Urine: >1.03 — ABNORMAL HIGH (ref 1.005–1.030)
pH: 5 (ref 5.0–8.0)

## 2021-01-30 LAB — CBC WITH DIFFERENTIAL/PLATELET
Abs Immature Granulocytes: 0.03 10*3/uL (ref 0.00–0.07)
Basophils Absolute: 0.1 10*3/uL (ref 0.0–0.1)
Basophils Relative: 0 %
Eosinophils Absolute: 0.3 10*3/uL (ref 0.0–0.5)
Eosinophils Relative: 2 %
HCT: 36.6 % (ref 36.0–46.0)
Hemoglobin: 11.5 g/dL — ABNORMAL LOW (ref 12.0–15.0)
Immature Granulocytes: 0 %
Lymphocytes Relative: 15 %
Lymphs Abs: 1.6 10*3/uL (ref 0.7–4.0)
MCH: 27.8 pg (ref 26.0–34.0)
MCHC: 31.4 g/dL (ref 30.0–36.0)
MCV: 88.4 fL (ref 80.0–100.0)
Monocytes Absolute: 0.8 10*3/uL (ref 0.1–1.0)
Monocytes Relative: 7 %
Neutro Abs: 8.4 10*3/uL — ABNORMAL HIGH (ref 1.7–7.7)
Neutrophils Relative %: 76 %
Platelets: 345 10*3/uL (ref 150–400)
RBC: 4.14 MIL/uL (ref 3.87–5.11)
RDW: 14.1 % (ref 11.5–15.5)
WBC: 11.2 10*3/uL — ABNORMAL HIGH (ref 4.0–10.5)
nRBC: 0 % (ref 0.0–0.2)

## 2021-01-30 LAB — COMPREHENSIVE METABOLIC PANEL
ALT: 12 U/L (ref 0–44)
AST: 15 U/L (ref 15–41)
Albumin: 4.1 g/dL (ref 3.5–5.0)
Alkaline Phosphatase: 80 U/L (ref 38–126)
Anion gap: 7 (ref 5–15)
BUN: 17 mg/dL (ref 8–23)
CO2: 28 mmol/L (ref 22–32)
Calcium: 8.8 mg/dL — ABNORMAL LOW (ref 8.9–10.3)
Chloride: 103 mmol/L (ref 98–111)
Creatinine, Ser: 1.32 mg/dL — ABNORMAL HIGH (ref 0.44–1.00)
GFR, Estimated: 42 mL/min — ABNORMAL LOW (ref >60)
Glucose, Bld: 103 mg/dL — ABNORMAL HIGH (ref 70–99)
Potassium: 4 mmol/L (ref 3.5–5.1)
Sodium: 138 mmol/L (ref 135–145)
Total Bilirubin: 0.3 mg/dL (ref 0.3–1.2)
Total Protein: 7.7 g/dL (ref 6.5–8.1)

## 2021-01-30 LAB — LIPASE, BLOOD: Lipase: 43 U/L (ref 11–51)

## 2021-01-30 MED ORDER — BACLOFEN 10 MG PO TABS: 10.0000 mg | ORAL_TABLET | Freq: Three times a day (TID) | ORAL | 0 refills | Status: AC | PRN

## 2021-01-30 NOTE — ED Triage Notes (Signed)
Pt c/o right sided lower back pain. She states it feels like cramps and radiates to the front of her. Started yesterday.

## 2021-01-30 NOTE — ED Provider Notes (Signed)
[facility]    CSN: [id number] Arrival date & time: 01/30/21  1636      History   Chief Complaint Chief Complaint  Patient presents with   Back Pain    HPI Cheryl Saunders is a 75 y.o. female presenting for onset of right upper quadrant abdominal pain with radiation to the right mid back.  Symptom onset was yesterday.  She says it feels like cramps.  Admits to a little bit increased pain when she takes of breath.  Does not feel short of breath.  Patient also says she just gets random pains that feel like a grabbing sensation or cramping.  Also admits to increased pain in her back when she leans forward.  Patient says she does not feel ill.  No fever, fatigue, cough, congestion, nausea/vomiting.  No effect with eating.  Admits no change in appetite.  Denies any urinary symptoms or lower abdominal pain.  Patient has appointment with PCP in 3 days.  Personal history of diabetes, GERD, hypertension and sleep apnea.  Patient reports history of abdominal hysterectomy, cholecystectomy, hernia repair.  HPI  Past Medical History:  Diagnosis Date   Diabetes mellitus without complication (HCC)    GERD (gastroesophageal reflux disease)    Heart murmur    followed by PCP   Hypertension    Sleep apnea    supposed to use CPAP. Does not.    Patient Active Problem List   Diagnosis Date Noted   Personal history of colonic polyps    Polyp of ascending colon    Diabetes ([place]) 12/18/2017   Essential hypertension 12/18/2017   OSA (obstructive sleep apnea) 12/18/2017   Special screening for malignant neoplasms, colon    Benign neoplasm of ascending colon    Chronic kidney disease, stage III (moderate) ([place]) 04/27/2013   Obesity 04/27/2013   Proteinuria 09/12/2012    Past Surgical History:  Procedure Laterality Date   ABDOMINAL HYSTERECTOMY     BREAST BIOPSY Right 11/22/2016    coil clip - PASH   CATARACT EXTRACTION W/ INTRAOCULAR LENS  IMPLANT, BILATERAL  2022    CHOLECYSTECTOMY     COLONOSCOPY WITH PROPOFOL N/A 10/17/2015   Procedure: COLONOSCOPY WITH PROPOFOL;  Surgeon: [name], MD;  Location: [place];  Service: Endoscopy;  Laterality: N/A;  Diabetic - oral meds Sleep apnea   COLONOSCOPY WITH PROPOFOL N/A 10/03/2020   Procedure: COLONOSCOPY WITH PROPOFOL;  Surgeon: [name], MD;  Location: [place];  Service: Endoscopy;  Laterality: N/A;  Diabetic   HERNIA REPAIR     POLYPECTOMY  10/17/2015   Procedure: POLYPECTOMY;  Surgeon: [name], MD;  Location: [place];  Service: Endoscopy;;  Ascending colon polyps x 2   POLYPECTOMY  10/03/2020   Procedure: POLYPECTOMY;  Surgeon: [name], MD;  Location: [place];  Service: Endoscopy;;   REPLACEMENT TOTAL KNEE Bilateral     OB History   No obstetric history on file.      Home Medications    Prior to Admission medications   Medication Sig Start Date End Date Taking? Authorizing Provider  acetaminophen (TYLENOL) 500 MG tablet Take 1 tablet (500 mg total) by mouth every 6 (six) hours as needed. 07/11/15  Yes Betancourt, Aura Fey, NP  amLODipine (NORVASC) 10 MG tablet Take 10 mg by mouth daily.   Yes [provider]  aspirin 81 MG tablet Take 81 mg by mouth daily.   Yes [provider]  baclofen (LIORESAL) 10 MG  tablet Take 1 tablet (10 mg total) by mouth 3 (three) times daily as needed for up to 5 days for muscle spasms. 01/30/21 02/04/21 Yes Laurene Footman B, PA-C  benazepril (LOTENSIN) 40 MG tablet Take 40 mg by mouth daily.   Yes [provider]  chlorthalidone (HYGROTON) 25 MG tablet Take 25 mg by mouth daily.   Yes [provider]  glipiZIDE (GLUCOTROL XL) 10 MG 24 hr tablet Take 10 mg by mouth 2 (two) times daily.   Yes [provider]  lovastatin (MEVACOR) 40 MG tablet Take 40 mg by mouth at bedtime.   Yes [provider]  metFORMIN (GLUCOPHAGE) 1000 MG tablet Take 1,000 mg by mouth 2 (two)  times daily with a meal.   Yes [provider]  metoprolol succinate (TOPROL-XL) 50 MG 24 hr tablet Take 50 mg by mouth daily. Take with or immediately following a meal.   Yes [provider]  Multiple Vitamin (MULTIVITAMIN) tablet Take 1 tablet by mouth daily.   Yes [provider]  omeprazole (PRILOSEC) 20 MG capsule Take 20 mg by mouth daily.   Yes [provider]  Semaglutide (OZEMPIC, 1 MG/DOSE, [place]) Inject 2.5 mg into the skin once a week.   Yes [provider]  liraglutide (VICTOZA) 18 MG/3ML SOPN Inject 1.8 mg into the skin once.    [provider]  mupirocin ointment (BACTROBAN) 2 % Apply 1 application topically 2 (two) times daily. 07/11/15   Betancourt, Aura Fey, NP  Sod Picosulfate-Mag Ox-Cit Acd (CLENPIQ) 10-3.5-12 MG-GM -GM/160ML SOLN Take 320 mLs by mouth as directed. 08/31/20   [name], MD  spironolactone (ALDACTONE) 25 MG tablet Take 25 mg by mouth daily. Patient not taking: Reported on 09/22/2020    [provider]  Vitamin D, Ergocalciferol, (DRISDOL) 50000 units CAPS capsule Take 50,000 Units by mouth once a week. Patient not taking: Reported on 09/22/2020 11/20/17   [provider]    Family History Family History  Problem Relation Age of Onset   Breast cancer Neg Hx     Social History Social History   Tobacco Use   Smoking status: Never   Smokeless tobacco: Never  Vaping Use   Vaping Use: Never used  Substance Use Topics   Alcohol use: No   Drug use: No     Allergies   Patient has no known allergies.   Review of Systems Review of Systems  Constitutional:  Negative for activity change, appetite change, fatigue and fever.  Respiratory:  Negative for cough and shortness of breath.   Gastrointestinal:  Positive for abdominal pain. Negative for blood in stool, constipation, diarrhea, nausea and vomiting.  Genitourinary:  Negative for dysuria and frequency.  Musculoskeletal:  Positive for  back pain. Negative for gait problem.  Neurological:  Negative for weakness and numbness.    Physical Exam Triage Vital Signs ED Triage Vitals  Enc Vitals Group     BP      Pulse      Resp      Temp      Temp src      SpO2      Weight      Height      Head Circumference      Peak Flow      Pain Score      Pain Loc      Pain Edu?      Excl. in [place]?    No data found.  Updated  Vital Signs BP (!) 156/61 (BP Location: Left Arm)   Pulse 66   Temp 98.2 F (36.8 C) (Oral)   Resp 18   Ht 5' (1.524 m)   Wt 179 lb 14.3 oz (81.6 kg)   SpO2 95%   BMI 35.13 kg/m      Physical Exam Vitals and nursing note reviewed.  Constitutional:      General: She is not in acute distress.    Appearance: Normal appearance. She is not ill-appearing or toxic-appearing.  HENT:     Head: Normocephalic and atraumatic.     Nose: Nose normal.     Mouth/Throat:     Mouth: Mucous membranes are moist.     Pharynx: Oropharynx is clear.  Eyes:     General: No scleral icterus.       Right eye: No discharge.        Left eye: No discharge.     Conjunctiva/sclera: Conjunctivae normal.  Cardiovascular:     Rate and Rhythm: Normal rate and regular rhythm.     Heart sounds: Normal heart sounds.  Pulmonary:     Effort: Pulmonary effort is normal. No respiratory distress.     Breath sounds: Normal breath sounds.  Abdominal:     Palpations: Abdomen is soft.     Tenderness: There is abdominal tenderness (RUQ). There is no right CVA tenderness or left CVA tenderness.  Musculoskeletal:     Cervical back: Neck supple.     Thoracic back: Normal.     Lumbar back: Normal.     Comments: Increased back pain with leaning forward  Skin:    General: Skin is dry.  Neurological:     General: No focal deficit present.     Mental Status: She is alert. Mental status is at baseline.     Motor: No weakness.     Gait: Gait normal.  Psychiatric:        Mood and Affect: Mood normal.        Behavior: Behavior  normal.        Thought Content: Thought content normal.     UC Treatments / Results  Labs (all labs ordered are listed, but only abnormal results are displayed) Labs Reviewed  CBC WITH DIFFERENTIAL/PLATELET - Abnormal; Notable for the following components:      Result Value   WBC 11.2 (*)    Hemoglobin 11.5 (*)    Neutro Abs 8.4 (*)    All other components within normal limits  COMPREHENSIVE METABOLIC PANEL - Abnormal; Notable for the following components:   Glucose, Bld 103 (*)    Creatinine, Ser 1.32 (*)    Calcium 8.8 (*)    GFR, Estimated 42 (*)    All other components within normal limits  URINALYSIS, COMPLETE (UACMP) WITH MICROSCOPIC - Abnormal; Notable for the following components:   APPearance HAZY (*)    Specific Gravity, Urine >1.030 (*)    Protein, ur >300 (*)    Bacteria, UA MANY (*)    All other components within normal limits  LIPASE, BLOOD    EKG   Radiology No results found.  Procedures Procedures (including critical care time)  Medications Ordered in UC Medications - No data to display  Initial Impression / Assessment and Plan / UC Course  I have reviewed the triage vital signs and the nursing notes.  Pertinent labs & imaging results that were available during my care of the patient were reviewed by me and considered in my medical  decision making (see chart for details).  75 year old female presents onset of right upper quadrant abdominal pain and right thoracic back pain over the past 5 days.  Is cramping in nature and worse with palpating right upper quadrant or leaning forward.  Associated fever, fatigue nausea/vomiting/diarrhea/constipation, black or bloody stools, chest pain coughing up, urinary symptoms, radiation of pain to extremities, numbness/tingling.Of note, patient has had cholecystectomy.  Vitals are stable BP is elevated at 136/61.  She is afebrile and overall well-appearing but she does hold her right upper abdomen at times and  complains of pain.  Exam significant for tenderness palpation of the right upper quadrant.  Patient also has right thoracic back pain when leaning forward.  UA shows greater than 1.030 specific gravity, proteinuria.  CBC shows slightly elevated WBC count 11.2..  Lipase normal.  CMP shows creatinine of 1.32 and GFR 42.  Most recent renal function I can find is from June 2021 which is about a year and a half ago.  Creatinine was 1.17 and GFR was 55.  Discussed results with patient.  Advised her there are many different causes for her type of pain.  Explained it could be related to muscle cramp or spasm originating from her back, possible shingles without rash, abdominal congestion/pulmonary/infection.  Discussed ED for imaging the patient had to wait after her doctor's appointment for 3 days.  Advised patient we can treat for possible muscle cramps and spasms.  Sent baclofen to pharmacy.  Thoroughly reviewed ED precautions if her pain were to worsen or she has fever, breathing difficulty, chest pain, vomiting, black or bloody stools, significant diarrhea or any other significant worsening of her symptoms before her PCP appointment.  Patient agrees to plan.  Final Clinical Impressions(s) / UC Diagnoses   Final diagnoses:  Right upper quadrant pain  Acute right-sided thoracic back pain  Muscle cramp     Discharge Instructions      -Your white blood cell count is slightly elevated.  Your kidney function is decreased.  I am unable to see recent kidney function labs than a year and a half ago and it is a little worse than it was a year and a half ago but nothing significant.  Your urine test was reassuring for no sign of infection.  You are dehydrated you need to drink more water. - We discussed different causes for your abdominal and flank pain including possible shingles without the rash yet.  If you do notice a rash need to be seen immediately and started on antiviral medicine.  Other causes could  include intra-abdominal infections, pneumonia, musculoskeletal back pain with radiation to the abdomen. - We can try treating you with a muscle relaxer.  If it is musculoskeletal it should improve with this.  Also consider Tylenol use of heating pad.  If you are not feeling better tomorrow you should go to emergency department to have imaging performed to rule out other possible causes. - If you develop a fever, breathing difficulty, chest pain, vomiting or feeling very nauseous, black or bloody stools, etc. you should go to the emergency department sooner, otherwise keep appointment with your PCP in 3 days.       ED Prescriptions     Medication Sig Dispense Auth. Provider   baclofen (LIORESAL) 10 MG tablet Take 1 tablet (10 mg total) by mouth 3 (three) times daily as needed for up to 5 days for muscle spasms. 15 each Gretta Cool      PDMP  not reviewed this encounter.   Danton Clap, PA-C 01/31/21 0830

## 2021-01-30 NOTE — Discharge Instructions (Signed)
-  Your white blood cell count is slightly elevated.  Your kidney function is decreased.  I am unable to see recent kidney function labs than a year and a half ago and it is a little worse than it was a year and a half ago but nothing significant.  Your urine test was reassuring for no sign of infection.  You are dehydrated you need to drink more water. - We discussed different causes for your abdominal and flank pain including possible shingles without the rash yet.  If you do notice a rash need to be seen immediately and started on antiviral medicine.  Other causes could include intra-abdominal infections, pneumonia, musculoskeletal back pain with radiation to the abdomen. - We can try treating you with a muscle relaxer.  If it is musculoskeletal it should improve with this.  Also consider Tylenol use of heating pad.  If you are not feeling better tomorrow you should go to emergency department to have imaging performed to rule out other possible causes. - If you develop a fever, breathing difficulty, chest pain, vomiting or feeling very nauseous, black or bloody stools, etc. you should go to the emergency department sooner, otherwise keep appointment with your PCP in 3 days.

## 2021-02-02 ENCOUNTER — Other Ambulatory Visit: Payer: Self-pay | Admitting: Internal Medicine

## 2021-02-02 ENCOUNTER — Other Ambulatory Visit (HOSPITAL_COMMUNITY): Payer: Self-pay | Admitting: Internal Medicine

## 2021-02-02 DIAGNOSIS — R1084 Generalized abdominal pain: Secondary | ICD-10-CM

## 2021-02-03 ENCOUNTER — Other Ambulatory Visit: Payer: Self-pay

## 2021-02-03 ENCOUNTER — Ambulatory Visit
Admission: RE | Admit: 2021-02-03 | Discharge: 2021-02-03 | Disposition: A | Discharge status: Home / Self Care | Payer: Medicare HMO | Source: Ambulatory Visit | Attending: Internal Medicine | Admitting: Internal Medicine

## 2021-02-03 DIAGNOSIS — R1084 Generalized abdominal pain: Secondary | ICD-10-CM | POA: Diagnosis present

## 2021-08-08 ENCOUNTER — Other Ambulatory Visit: Payer: Self-pay | Admitting: Internal Medicine

## 2021-08-08 DIAGNOSIS — Z1231 Encounter for screening mammogram for malignant neoplasm of breast: Secondary | ICD-10-CM

## 2021-12-19 ENCOUNTER — Ambulatory Visit
Admission: RE | Admit: 2021-12-19 | Discharge: 2021-12-19 | Disposition: A | Discharge status: Home / Self Care | Payer: Medicare HMO | Source: Ambulatory Visit | Attending: Internal Medicine | Admitting: Internal Medicine

## 2021-12-19 DIAGNOSIS — Z1231 Encounter for screening mammogram for malignant neoplasm of breast: Secondary | ICD-10-CM | POA: Diagnosis present

## 2022-03-26 ENCOUNTER — Ambulatory Visit
Admission: EM | Admit: 2022-03-26 | Discharge: 2022-03-26 | Disposition: A | Discharge status: Home / Self Care | Payer: Medicare HMO | Attending: Emergency Medicine | Admitting: Emergency Medicine

## 2022-03-26 ENCOUNTER — Encounter: Payer: Self-pay | Admitting: Emergency Medicine

## 2022-03-26 DIAGNOSIS — M25511 Pain in right shoulder: Secondary | ICD-10-CM | POA: Diagnosis not present

## 2022-03-26 MED ORDER — BACLOFEN 5 MG PO TABS: 5.0000 mg | ORAL_TABLET | Freq: Every evening | ORAL | 0 refills | Status: DC | PRN

## 2022-03-26 MED ORDER — DEXAMETHASONE SODIUM PHOSPHATE 10 MG/ML IJ SOLN
10.0000 mg | Freq: Once | INTRAMUSCULAR | Status: AC
  Administered 2022-03-26: 10 mg via INTRAMUSCULAR

## 2022-03-26 MED ORDER — PREDNISONE 20 MG PO TABS: 40.0000 mg | ORAL_TABLET | Freq: Every day | ORAL | 0 refills | Status: DC

## 2022-03-26 NOTE — Discharge Instructions (Signed)
Your pain is most likely caused by irritation to the muscles and joint  Given an injection of Decadron which is a steroid here in the office to help reduce inflammation, ideally will start to have some relief in about 30 minutes  Starting tomorrow take prednisone every morning with food for 5 days to continue this process, you may take Tylenol in addition to this medicine, be mindful this will increase your blood sugars, monitor closely, will return to normal after medication is stopped  You may use baclofen at bedtime as needed for additional comfort  You may use heating pad in 15 minute intervals as needed for additional comfort, you may find comfort in using ice in 10-15 minutes over affected area  Begin stretching affected area daily for 10 minutes as tolerated to further loosen muscles   When lying down place pillow underneath and between knees for support  Can try sleeping without pillow on firm mattress   Practice good posture: head back, shoulders back, chest forward, pelvis back and weight distributed evenly on both legs  If pain persist after recommended treatment or reoccurs if may be beneficial to follow up with orthopedic specialist for evaluation, this doctor specializes in the bones and can manage your symptoms long-term with options such as but not limited to imaging, medications or physical therapy

## 2022-03-26 NOTE — ED Provider Notes (Signed)
[facility]    CSN: [id number] Arrival date & time: 03/26/22  1103      History   Chief Complaint Chief Complaint  Patient presents with   Shoulder Pain    right    HPI ADDILYNN Saunders is a 77 y.o. female.   Patient presents for evaluation of constant anterior right shoulder pain beginning 2 weeks ago without precipitating event, injury or trauma.  Pain radiates up into the side of the neck and is causing the right hand to feel weak.  Endorses she is unable to fully lift arm above head.  Symptoms have occurred before and she believes that she has arthritis.  Denies numbness or tingling.  Has attempted use of ice, heat, NSAIDs, Tylenol, topical over-the-counter products and heating pads without relief.    Past Medical History:  Diagnosis Date   Diabetes mellitus without complication (HCC)    GERD (gastroesophageal reflux disease)    Heart murmur    followed by PCP   Hypertension    Sleep apnea    supposed to use CPAP. Does not.    Patient Active Problem List   Diagnosis Date Noted   Personal history of colonic polyps    Polyp of ascending colon    Diabetes ([place]) 12/18/2017   Essential hypertension 12/18/2017   OSA (obstructive sleep apnea) 12/18/2017   Special screening for malignant neoplasms, colon    Benign neoplasm of ascending colon    Chronic kidney disease, stage III (moderate) ([place]) 04/27/2013   Obesity 04/27/2013   Proteinuria 09/12/2012    Past Surgical History:  Procedure Laterality Date   ABDOMINAL HYSTERECTOMY     BREAST BIOPSY Right 11/22/2016    coil clip - PASH   CATARACT EXTRACTION W/ INTRAOCULAR LENS  IMPLANT, BILATERAL  2022   CHOLECYSTECTOMY     COLONOSCOPY WITH PROPOFOL N/A 10/17/2015   Procedure: COLONOSCOPY WITH PROPOFOL;  Surgeon: [name], MD;  Location: [place];  Service: Endoscopy;  Laterality: N/A;  Diabetic - oral meds Sleep apnea   COLONOSCOPY WITH PROPOFOL N/A 10/03/2020   Procedure: COLONOSCOPY WITH  PROPOFOL;  Surgeon: [name], MD;  Location: [place];  Service: Endoscopy;  Laterality: N/A;  Diabetic   HERNIA REPAIR     POLYPECTOMY  10/17/2015   Procedure: POLYPECTOMY;  Surgeon: [name], MD;  Location: [place];  Service: Endoscopy;;  Ascending colon polyps x 2   POLYPECTOMY  10/03/2020   Procedure: POLYPECTOMY;  Surgeon: [name], MD;  Location: [place];  Service: Endoscopy;;   REPLACEMENT TOTAL KNEE Bilateral     OB History   No obstetric history on file.      Home Medications    Prior to Admission medications   Medication Sig Start Date End Date Taking? Authorizing Provider  acetaminophen (TYLENOL) 500 MG tablet Take 1 tablet (500 mg total) by mouth every 6 (six) hours as needed. 07/11/15  Yes Betancourt, Aura Fey, NP  amLODipine (NORVASC) 10 MG tablet Take 10 mg by mouth daily.   Yes [provider]  aspirin 81 MG tablet Take 81 mg by mouth daily.   Yes [provider]  benazepril (LOTENSIN) 40 MG tablet Take 40 mg by mouth daily.   Yes [provider]  chlorthalidone (HYGROTON) 25 MG tablet Take 25 mg by mouth daily.   Yes [provider]  glipiZIDE (GLUCOTROL XL) 10 MG 24 hr tablet Take 10 mg by mouth 2 (two) times daily.   Yes  [provider]  lovastatin (MEVACOR) 40 MG tablet Take 40 mg by mouth at bedtime.   Yes [provider]  metFORMIN (GLUCOPHAGE) 1000 MG tablet Take 1,000 mg by mouth 2 (two) times daily with a meal.   Yes [provider]  metoprolol succinate (TOPROL-XL) 50 MG 24 hr tablet Take 50 mg by mouth daily. Take with or immediately following a meal.   Yes [provider]  omeprazole (PRILOSEC) 20 MG capsule Take 20 mg by mouth daily.   Yes [provider]  Semaglutide (OZEMPIC, 1 MG/DOSE, [place]) Inject 2.5 mg into the skin once a week.   Yes [provider]  Vitamin D, Ergocalciferol, (DRISDOL) 50000 units CAPS capsule Take 50,000  Units by mouth once a week. 11/20/17  Yes [provider]  liraglutide (VICTOZA) 18 MG/3ML SOPN Inject 1.8 mg into the skin once.    [provider]  Multiple Vitamin (MULTIVITAMIN) tablet Take 1 tablet by mouth daily.    [provider]  mupirocin ointment (BACTROBAN) 2 % Apply 1 application topically 2 (two) times daily. 07/11/15   Betancourt, Aura Fey, NP  Sod Picosulfate-Mag Ox-Cit Acd (CLENPIQ) 10-3.5-12 MG-GM -GM/160ML SOLN Take 320 mLs by mouth as directed. 08/31/20   [name], MD  spironolactone (ALDACTONE) 25 MG tablet Take 25 mg by mouth daily. Patient not taking: Reported on 09/22/2020    [provider]    Family History Family History  Problem Relation Age of Onset   Breast cancer Sister 76    Social History Social History   Tobacco Use   Smoking status: Never   Smokeless tobacco: Never  Vaping Use   Vaping Use: Never used  Substance Use Topics   Alcohol use: No   Drug use: No     Allergies   Patient has no known allergies.   Review of Systems Review of Systems  Constitutional: Negative.   Respiratory: Negative.    Cardiovascular: Negative.   Musculoskeletal:  Positive for myalgias. Negative for arthralgias, back pain, gait problem, joint swelling, neck pain and neck stiffness.  Skin: Negative.   Neurological: Negative.      Physical Exam Triage Vital Signs ED Triage Vitals  Enc Vitals Group     BP 03/26/22 1230 (!) 162/77     Pulse Rate 03/26/22 1230 65     Resp 03/26/22 1230 16     Temp 03/26/22 1230 98.2 F (36.8 C)     Temp Source 03/26/22 1230 Oral     SpO2 03/26/22 1230 97 %     Weight 03/26/22 1228 179 lb 14.3 oz (81.6 kg)     Height 03/26/22 1228 5' (1.524 m)     Head Circumference --      Peak Flow --      Pain Score 03/26/22 1227 5     Pain Loc --      Pain Edu? --      Excl. in [place]? --    No data found.  Updated Vital Signs BP (!) 162/77 (BP Location: Left Arm)   Pulse 65   Temp 98.2 F (36.8  C) (Oral)   Resp 16   Ht 5' (1.524 m)   Wt 179 lb 14.3 oz (81.6 kg)   SpO2 97%   BMI 35.13 kg/m   Visual Acuity Right Eye Distance:   Left Eye Distance:   Bilateral Distance:    Right Eye Near:   Left Eye Near:    Bilateral Near:  Physical Exam Constitutional:      Appearance: Normal appearance.  Eyes:     Extraocular Movements: Extraocular movements intact.  Pulmonary:     Effort: Pulmonary effort is normal.  Musculoskeletal:     Comments: Tenderness is present along the anterior of the right shoulder without ecchymosis, swelling or deformity, limited range of motion unable to fully complete lateral extension due to pain elicited, 2+ brachial pulse, negative [place]  Skin:    General: Skin is warm and dry.  Neurological:     Mental Status: She is alert and oriented to person, place, and time. Mental status is at baseline.      UC Treatments / Results  Labs (all labs ordered are listed, but only abnormal results are displayed) Labs Reviewed - No data to display  EKG   Radiology No results found.  Procedures Procedures (including critical care time)  Medications Ordered in UC Medications - No data to display  Initial Impression / Assessment and Plan / UC Course  I have reviewed the triage vital signs and the nursing notes.  Pertinent labs & imaging results that were available during my care of the patient were reviewed by me and considered in my medical decision making (see chart for details).  Acute right shoulder pain  Etiology is most likely muscular, low suspicion of injury to the bone therefore imaging deferred, discussed with patient, requesting cortisone injection, discussed capabilities of urgent care and Decadron injection into the muscle given in office, prednisone course prescribed for outpatient as well as baclofen, discussed effects on CBG and advised to monitor closely, may continue use of supportive measures with follow-up with orthopedics  if symptoms persist or worsen Final Clinical Impressions(s) / UC Diagnoses   Final diagnoses:  None   Discharge Instructions   None    ED Prescriptions   None    PDMP not reviewed this encounter.   Hans Eden, NP 03/26/22 1309

## 2022-03-26 NOTE — ED Triage Notes (Signed)
Pt c/o right shoulder pain. Started about 2 weeks ago.  She has tried Tylenol, ibuprofen, topical ointments, heating pads. No known injury. She states she has arthritis.

## 2022-04-12 ENCOUNTER — Other Ambulatory Visit: Payer: Self-pay | Admitting: Internal Medicine

## 2022-04-12 DIAGNOSIS — M8589 Other specified disorders of bone density and structure, multiple sites: Secondary | ICD-10-CM

## 2022-04-13 ENCOUNTER — Encounter: Payer: Self-pay | Admitting: Cardiovascular Disease

## 2022-04-13 ENCOUNTER — Ambulatory Visit (INDEPENDENT_AMBULATORY_CARE_PROVIDER_SITE_OTHER): Payer: Medicare HMO | Admitting: Cardiovascular Disease

## 2022-04-13 VITALS — BP 138/80 | HR 64 | Ht 60.0 in | Wt 178.2 lb

## 2022-04-13 DIAGNOSIS — E782 Mixed hyperlipidemia: Secondary | ICD-10-CM | POA: Diagnosis not present

## 2022-04-13 DIAGNOSIS — N181 Chronic kidney disease, stage 1: Secondary | ICD-10-CM

## 2022-04-13 DIAGNOSIS — I340 Nonrheumatic mitral (valve) insufficiency: Secondary | ICD-10-CM

## 2022-04-13 DIAGNOSIS — R0789 Other chest pain: Secondary | ICD-10-CM | POA: Diagnosis not present

## 2022-04-13 DIAGNOSIS — I10 Essential (primary) hypertension: Secondary | ICD-10-CM | POA: Diagnosis not present

## 2022-04-13 DIAGNOSIS — E1122 Type 2 diabetes mellitus with diabetic chronic kidney disease: Secondary | ICD-10-CM

## 2022-04-13 DIAGNOSIS — Z794 Long term (current) use of insulin: Secondary | ICD-10-CM

## 2022-04-13 NOTE — Progress Notes (Signed)
Cardiology Office Note   Date:  04/13/2022   ID:  CHAN RACZKA, DOB 1945-10-26, MRN [id number]  PCP:  Casilda Carls, MD  Cardiologist:  Neoma Laming, MD      History of Present Illness: Cheryl Saunders is a 77 y.o. female who presents for  Chief Complaint  Patient presents with   Pain    Went to urgent care for neck and chest pain    Chest Pain  This is a chronic problem. The current episode started more than 1 year ago. The pain is at a severity of 5/10. The pain is moderate. The quality of the pain is described as numbness.   Seen in [facility] and MRI neck showed pinched nerves.   Past Medical History:  Diagnosis Date   Diabetes mellitus without complication (HCC)    GERD (gastroesophageal reflux disease)    Heart murmur    followed by PCP   Hypertension    Sleep apnea    supposed to use CPAP. Does not.     Past Surgical History:  Procedure Laterality Date   ABDOMINAL HYSTERECTOMY     BREAST BIOPSY Right 11/22/2016    coil clip - PASH   CATARACT EXTRACTION W/ INTRAOCULAR LENS  IMPLANT, BILATERAL  2022   CHOLECYSTECTOMY     COLONOSCOPY WITH PROPOFOL N/A 10/17/2015   Procedure: COLONOSCOPY WITH PROPOFOL;  Surgeon: [name], MD;  Location: [place];  Service: Endoscopy;  Laterality: N/A;  Diabetic - oral meds Sleep apnea   COLONOSCOPY WITH PROPOFOL N/A 10/03/2020   Procedure: COLONOSCOPY WITH PROPOFOL;  Surgeon: [name], MD;  Location: [place];  Service: Endoscopy;  Laterality: N/A;  Diabetic   HERNIA REPAIR     POLYPECTOMY  10/17/2015   Procedure: POLYPECTOMY;  Surgeon: [name], MD;  Location: [place];  Service: Endoscopy;;  Ascending colon polyps x 2   POLYPECTOMY  10/03/2020   Procedure: POLYPECTOMY;  Surgeon: [name], MD;  Location: [place];  Service: Endoscopy;;   REPLACEMENT TOTAL KNEE Bilateral      Current Outpatient Medications  Medication Sig Dispense Refill   amLODipine (NORVASC) 10 MG  tablet Take 10 mg by mouth daily.     Baclofen 5 MG TABS Take 1 tablet (5 mg total) by mouth at bedtime as needed. 15 tablet 0   benazepril (LOTENSIN) 40 MG tablet Take 40 mg by mouth daily.     chlorthalidone (HYGROTON) 25 MG tablet Take 25 mg by mouth daily.     cyanocobalamin (VITAMIN B12) 1000 MCG tablet Take 1,000 mcg by mouth daily.     glipiZIDE (GLUCOTROL XL) 10 MG 24 hr tablet Take 10 mg by mouth 2 (two) times daily.     lovastatin (MEVACOR) 40 MG tablet Take 40 mg by mouth at bedtime.     metFORMIN (GLUCOPHAGE) 1000 MG tablet Take 1,000 mg by mouth 2 (two) times daily with a meal.     metoprolol succinate (TOPROL-XL) 50 MG 24 hr tablet Take 50 mg by mouth daily. Take with or immediately following a meal.     Multiple Vitamin (MULTIVITAMIN) tablet Take 1 tablet by mouth daily.     mupirocin ointment (BACTROBAN) 2 % Apply 1 application topically 2 (two) times daily. 22 g 0   omeprazole (PRILOSEC) 20 MG capsule Take 20 mg by mouth daily.     predniSONE (DELTASONE) 20 MG tablet Take 2 tablets (40 mg total) by mouth daily. 10 tablet 0  Semaglutide (OZEMPIC, 1 MG/DOSE, [place]) Inject 2.5 mg into the skin once a week.     Vitamin D, Ergocalciferol, (DRISDOL) 50000 units CAPS capsule Take 50,000 Units by mouth once a week.  1   acetaminophen (TYLENOL) 500 MG tablet Take 1 tablet (500 mg total) by mouth every 6 (six) hours as needed. 30 tablet 0   aspirin 81 MG tablet Take 81 mg by mouth daily.     liraglutide (VICTOZA) 18 MG/3ML SOPN Inject 1.8 mg into the skin once. (Patient not taking: Reported on 04/13/2022)     Sod Picosulfate-Mag Ox-Cit Acd (CLENPIQ) 10-3.5-12 MG-GM -GM/160ML SOLN Take 320 mLs by mouth as directed. (Patient not taking: Reported on 04/13/2022) 320 mL 0   spironolactone (ALDACTONE) 25 MG tablet Take 25 mg by mouth daily. (Patient not taking: Reported on 09/22/2020)     No current facility-administered medications for this visit.    Allergies:   Patient has no known allergies.     Social History:   reports that she has never smoked. She has never used smokeless tobacco. She reports that she does not drink alcohol and does not use drugs.   Family History:  family history includes Breast cancer (age of onset: 65) in her sister.    ROS:     Review of Systems  Constitutional: Negative.   HENT: Negative.    Eyes: Negative.   Respiratory: Negative.    Cardiovascular:  Positive for chest pain.  Gastrointestinal: Negative.   Genitourinary: Negative.   Musculoskeletal: Negative.   Skin: Negative.   Neurological: Negative.   Endo/Heme/Allergies: Negative.   Psychiatric/Behavioral: Negative.    All other systems reviewed and are negative.     All other systems are reviewed and negative.    PHYSICAL EXAM: VS:  BP 138/80   Pulse 64   Ht 5' (1.524 m)   Wt 178 lb 3.2 oz (80.8 kg)   SpO2 96%   BMI 34.80 kg/m  , BMI Body mass index is 34.8 kg/m. Last weight:  Wt Readings from Last 3 Encounters:  04/13/22 178 lb 3.2 oz (80.8 kg)  03/26/22 179 lb 14.3 oz (81.6 kg)  01/30/21 179 lb 14.3 oz (81.6 kg)     Physical Exam Constitutional:      Appearance: Normal appearance.  Cardiovascular:     Rate and Rhythm: Normal rate and regular rhythm.     Heart sounds: Normal heart sounds.  Pulmonary:     Effort: Pulmonary effort is normal.     Breath sounds: Normal breath sounds.  Musculoskeletal:     Right lower leg: No edema.     Left lower leg: No edema.  Neurological:     Mental Status: She is alert.        REASON FOR VISIT  Visit for: Echocardiogram/R00.2  Sex:   Female   wt= 175   lbs.  BP=142/72  Height=  60  inches.        TESTS  Imaging: Echocardiogram:  An echocardiogram in (2-d) mode was performed and in Doppler mode with color flow velocity mapping was performed. The aortic valve cusps are abnormal 0.9   cm, flow velocity 1.82   m/s, and systolic calculated mean flow gradient 7  mmHg. Mitral valve diastolic peak flow velocity E .849     m/s and E/A ratio 0.6. Aortic root diameter 2.7 cm. The LVOT internal diameter 2.4  cm and flow velocity was abnormal 1.17  m/s. LV systolic dimension 2.4 cm, diastolic 0000000 cm,  posterior wall thickness 1.12   cm, fractional shortening 46.1  %, and EF 77.6 %. IVS thickness 1.16  cm. LA dimension 5.0 cm. Tricuspid Valve has Mild to Moderate Regurgitation. Mitral Valve has Mild to Moderate Regurgitation. Pulmonic Valve has Mild Regurgitation.     ASSESSMENT  Technically adequate study.  Normal chamber sizes.  Normal left ventricular systolic function.  Mild left ventricular hypertrophy with GRADE 1 (relaxation abnormality) diastolic dysfunction.  Normal right ventricular systolic function.  Normal right ventricular diastolic function.  Normal left ventricular wall motion.  Normal right ventricular wall motion.  Mild  pulmonary regurgitation.  Mild to Moderate  tricuspid regurgitation.  Moderate pulmonary hypertension.  Mild to Moderate mitral regurgitation.  No pericardial effusion.  Mild to Moderately dilated Left atrium  Mild LVH.     THERAPY   Referring physician: Dionisio David  Sonographer: Neoma Laming.      Neoma Laming MD  Electronically signed by: Neoma Laming     Date: 10/31/2021 08:49  REASON FOR VISIT  Referred by Dr.Aarish Rockers Humphrey Rolls.        TESTS  Imaging: Computed Tomographic Angiography:  Cardiac multidetector CT was performed paying particular attention to the coronary arteries for the diagnosis of: R94.30. Diagnostic Drugs:  Administered iohexol (Omnipaque) through an antecubital vein and images from the examination were analyzed for the presence and extent of coronary artery disease, using 3D image processing software. 100 mL of non-ionic contrast (Omnipaque) was used.     TEST CONCLUSIONS  Quality of study: Excellent  1-Calcium score: 55.5  2-Right dominant system.  3-Normal Coronaries.      Neoma Laming MD  Electronically signed by:  Neoma Laming     Date: 11/30/2021 14:22 Recent Labs: No results found for requested labs within last 365 days.    Lipid Panel No results found for: "CHOL", "TRIG", "HDL", "CHOLHDL", "VLDL", "LDLCALC", "LDLDIRECT"    Other studies Reviewed: Additional studies/ records that were reviewed today include:  Review of the above records demonstrates:       No data to display            ASSESSMENT AND PLAN:    ICD-10-CM   1. Chest pain, non-cardiac  R07.89    CCTA showed ca score 55, normal coronaries, and MRI neck showed pinched nerves, thus non cardic chest pain    2. Primary hypertension  I10     3. Nonrheumatic mitral valve regurgitation  I34.0    mild to moderate MR    4. Mixed hyperlipidemia  E78.2     5. 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     Signed,  Neoma Laming, MD  04/13/2022 10:52 AM    [facility]

## 2022-04-24 ENCOUNTER — Ambulatory Visit (INDEPENDENT_AMBULATORY_CARE_PROVIDER_SITE_OTHER): Payer: Medicare HMO

## 2022-04-24 DIAGNOSIS — M8589 Other specified disorders of bone density and structure, multiple sites: Secondary | ICD-10-CM | POA: Diagnosis not present

## 2022-05-01 ENCOUNTER — Ambulatory Visit: Payer: Medicare HMO | Admitting: Cardiovascular Disease

## 2022-07-02 ENCOUNTER — Ambulatory Visit
Admission: EM | Admit: 2022-07-02 | Discharge: 2022-07-02 | Disposition: A | Discharge status: Home / Self Care | Payer: Medicare HMO | Attending: Physician Assistant | Admitting: Physician Assistant

## 2022-07-02 ENCOUNTER — Ambulatory Visit (INDEPENDENT_AMBULATORY_CARE_PROVIDER_SITE_OTHER): Payer: Medicare HMO

## 2022-07-02 DIAGNOSIS — M545 Low back pain, unspecified: Secondary | ICD-10-CM | POA: Diagnosis not present

## 2022-07-02 DIAGNOSIS — S3992XA Unspecified injury of lower back, initial encounter: Secondary | ICD-10-CM

## 2022-07-02 MED ORDER — TRAMADOL HCL 50 MG PO TABS: 50.0000 mg | ORAL_TABLET | Freq: Four times a day (QID) | ORAL | 0 refills | Status: AC | PRN

## 2022-07-02 NOTE — Discharge Instructions (Signed)

## 2022-07-02 NOTE — ED Triage Notes (Signed)
Pt report she tripped and fell over an exercising machine and fell on a bench and hit her lower back on it this morning. Pt states it is hard for her to walk due to pain  and it stings.

## 2022-07-02 NOTE — ED Provider Notes (Signed)
[facility]    CSN: [id number] Arrival date & time: 07/02/22  1342      History   Chief Complaint No chief complaint on file.   HPI Cheryl Saunders is a 77 y.o. female with history of diabetes, hypertension, CKD stage III, chronic neck pain with radiculopathy, bilateral knee pain status post knee replacement 20 years ago.  Patient presents today for right lower back pain after tripping over her son's exercise equipment and falling onto it.  Denies any pain radiating down the leg.  No head injury or loss conscious.  No numbness, weakness or tingling of lower extremities.  Has tried meloxicam 7.5 mg as prescribed by Ortho for her neck without relief.  Also takes gabapentin.  Patient says nothing has really helped the pain.  No other complaints.  HPI  Past Medical History:  Diagnosis Date   Diabetes mellitus without complication (HCC)    GERD (gastroesophageal reflux disease)    Heart murmur    followed by PCP   Hypertension    Sleep apnea    supposed to use CPAP. Does not.    Patient Active Problem List   Diagnosis Date Noted   Personal history of colonic polyps    Polyp of ascending colon    Diabetes (HCC) 12/18/2017   Essential hypertension 12/18/2017   OSA (obstructive sleep apnea) 12/18/2017   Special screening for malignant neoplasms, colon    Benign neoplasm of ascending colon    Chronic kidney disease, stage III (moderate) (HCC) 04/27/2013   Obesity 04/27/2013   Proteinuria 09/12/2012    Past Surgical History:  Procedure Laterality Date   ABDOMINAL HYSTERECTOMY     BREAST BIOPSY Right 11/22/2016    coil clip - PASH   CATARACT EXTRACTION W/ INTRAOCULAR LENS  IMPLANT, BILATERAL  2022   CHOLECYSTECTOMY     COLONOSCOPY WITH PROPOFOL N/A 10/17/2015   Procedure: COLONOSCOPY WITH PROPOFOL;  Surgeon: Midge Minium, MD;  Location: [facility];  Service: Endoscopy;  Laterality: N/A;  Diabetic - oral meds Sleep apnea   COLONOSCOPY WITH PROPOFOL N/A  10/03/2020   Procedure: COLONOSCOPY WITH PROPOFOL;  Surgeon: Midge Minium, MD;  Location: [facility];  Service: Endoscopy;  Laterality: N/A;  Diabetic   HERNIA REPAIR     POLYPECTOMY  10/17/2015   Procedure: POLYPECTOMY;  Surgeon: Midge Minium, MD;  Location: [facility];  Service: Endoscopy;;  Ascending colon polyps x 2   POLYPECTOMY  10/03/2020   Procedure: POLYPECTOMY;  Surgeon: Midge Minium, MD;  Location: [facility];  Service: Endoscopy;;   REPLACEMENT TOTAL KNEE Bilateral     OB History   No obstetric history on file.      Home Medications    Prior to Admission medications   Medication Sig Start Date End Date Taking? Authorizing Provider  traMADol (ULTRAM) 50 MG tablet Take 1 tablet (50 mg total) by mouth every 6 (six) hours as needed for up to 3 days for severe pain. 07/02/22 07/05/22 Yes Shirlee Latch, PA-C  acetaminophen (TYLENOL) 500 MG tablet Take 1 tablet (500 mg total) by mouth every 6 (six) hours as needed. 07/11/15   Betancourt, Jarold Song, NP  amLODipine (NORVASC) 10 MG tablet Take 10 mg by mouth daily.    [provider]  aspirin 81 MG tablet Take 81 mg by mouth daily.    [provider]  Baclofen 5 MG TABS Take 1 tablet (5 mg total) by mouth at bedtime as needed. 03/26/22  White, Adrienne R, NP  benazepril (LOTENSIN) 40 MG tablet Take 40 mg by mouth daily.    [provider]  chlorthalidone (HYGROTON) 25 MG tablet Take 25 mg by mouth daily.    [provider]  cyanocobalamin (VITAMIN B12) 1000 MCG tablet Take 1,000 mcg by mouth daily.    [provider]  glipiZIDE (GLUCOTROL XL) 10 MG 24 hr tablet Take 10 mg by mouth 2 (two) times daily.    [provider]  liraglutide (VICTOZA) 18 MG/3ML SOPN Inject 1.8 mg into the skin once. Patient not taking: Reported on 04/13/2022    [provider]  lovastatin (MEVACOR) 40 MG tablet Take 40 mg by mouth at bedtime.    [provider]  metFORMIN  (GLUCOPHAGE) 1000 MG tablet Take 1,000 mg by mouth 2 (two) times daily with a meal.    [provider]  metoprolol succinate (TOPROL-XL) 50 MG 24 hr tablet Take 50 mg by mouth daily. Take with or immediately following a meal.    [provider]  Multiple Vitamin (MULTIVITAMIN) tablet Take 1 tablet by mouth daily.    [provider]  mupirocin ointment (BACTROBAN) 2 % Apply 1 application topically 2 (two) times daily. 07/11/15   Betancourt, Jarold Song, NP  omeprazole (PRILOSEC) 20 MG capsule Take 20 mg by mouth daily.    [provider]  Semaglutide (OZEMPIC, 1 MG/DOSE, [place]) Inject 2.5 mg into the skin once a week.    [provider]  Sod Picosulfate-Mag Ox-Cit Acd (CLENPIQ) 10-3.5-12 MG-GM -GM/160ML SOLN Take 320 mLs by mouth as directed. Patient not taking: Reported on 04/13/2022 08/31/20   Midge Minium, MD  spironolactone (ALDACTONE) 25 MG tablet Take 25 mg by mouth daily. Patient not taking: Reported on 09/22/2020    [provider]  Vitamin D, Ergocalciferol, (DRISDOL) 50000 units CAPS capsule Take 50,000 Units by mouth once a week. 11/20/17   [provider]    Family History Family History  Problem Relation Age of Onset   Breast cancer Sister 61    Social History Social History   Tobacco Use   Smoking status: Never   Smokeless tobacco: Never  Vaping Use   Vaping Use: Never used  Substance Use Topics   Alcohol use: No   Drug use: No     Allergies   Patient has no known allergies.   Review of Systems Review of Systems  Cardiovascular:  Negative for chest pain.  Gastrointestinal:  Negative for abdominal pain and vomiting.  Musculoskeletal:  Positive for arthralgias (chronic knee and hip pain) and back pain (acute on chronic). Negative for gait problem and joint swelling.  Neurological:  Negative for weakness and numbness.     Physical Exam Triage Vital Signs ED Triage Vitals  Enc Vitals Group     BP 07/02/22 1403  (!) 147/64     Pulse Rate 07/02/22 1403 (!) 55     Resp 07/02/22 1403 20     Temp 07/02/22 1403 98.3 F (36.8 C)     Temp Source 07/02/22 1403 Oral     SpO2 07/02/22 1403 99 %     Weight --      Height --      Head Circumference --      Peak Flow --      Pain Score 07/02/22 1409 8     Pain Loc --      Pain Edu? --      Excl. in GC? --  No data found.  Updated Vital Signs BP (!) 147/64 (BP Location: Left Arm)   Pulse (!) 55   Temp 98.3 F (36.8 C) (Oral)   Resp 20   SpO2 99%    Physical Exam Vitals and nursing note reviewed.  Constitutional:      General: She is not in acute distress.    Appearance: Normal appearance. She is not ill-appearing or toxic-appearing.  HENT:     Head: Normocephalic and atraumatic.  Eyes:     General: No scleral icterus.       Right eye: No discharge.        Left eye: No discharge.     Conjunctiva/sclera: Conjunctivae normal.  Cardiovascular:     Rate and Rhythm: Regular rhythm. Bradycardia present.     Heart sounds: Normal heart sounds.  Pulmonary:     Effort: Pulmonary effort is normal. No respiratory distress.     Breath sounds: Normal breath sounds.  Musculoskeletal:     Cervical back: Neck supple.     Lumbar back: Tenderness present. Decreased range of motion. Negative right straight leg raise test (TTP right paralumbar muscles) and negative left straight leg raise test.  Skin:    General: Skin is dry.  Neurological:     General: No focal deficit present.     Mental Status: She is alert. Mental status is at baseline.     Motor: No weakness.     Gait: Gait abnormal.  Psychiatric:        Mood and Affect: Mood normal.        Behavior: Behavior normal.        Thought Content: Thought content normal.      UC Treatments / Results  Labs (all labs ordered are listed, but only abnormal results are displayed) Labs Reviewed - No data to display  EKG   Radiology DG Lumbar Spine Complete  Result Date: 07/02/2022 CLINICAL  DATA:  Back pain after an injury. EXAM: LUMBAR SPINE - COMPLETE 4+ VIEW COMPARISON:  None Available. FINDINGS: Grade 1 anterolisthesis of L4 on L5, measuring 10 mm. Alignment is otherwise anatomic. Vertebral body heights are maintained. Endplate degenerative changes and loss of disc space height in the thoracic spine and T12-L1, L1-2. Slight loss of disc space height at L4-5. Facet hypertrophy in the lower lumbar spine. No definite pars defects. IMPRESSION: 1. Grade 1 anterolisthesis of L4 on L5 with associated slight loss of disc space height. 2. Multilevel degenerative disc disease, as described above. 3. Lower lumbar facet hypertrophy. Electronically Signed   By: Leanna Battles M.D.   On: 07/02/2022 14:41    Procedures Procedures (including critical care time)  Medications Ordered in UC Medications - No data to display  Initial Impression / Assessment and Plan / UC Course  I have reviewed the triage vital signs and the nursing notes.  Pertinent labs & imaging results that were available during my care of the patient were reviewed by me and considered in my medical decision making (see chart for details).   77 year old female presents for right lower back pain after hitting her back on a piece of exercise equipment today.  X-ray performed today shows grade 1 anterolisthesis of L4 on L5 with associated slight loss of disc space height as well as multilevel degenerative disc disease and lower lumbar facet hypertrophy.  The anterolisthesis of L4 on L5 is not new and is present on x-ray from 2017.  Discussed results with patient.  Likely soft tissue injury  but she describes the pain as a burning sensation and has a history of a pinched nerve in her neck.  States symptoms feel similar.  Will have her continue with the meloxicam and gabapentin.  Sent tramadol as needed for severe pain.  Advised to follow-up with Ortho.  ED precautions discussed.   Final Clinical Impressions(s) / UC Diagnoses    Final diagnoses:  Injury of back, initial encounter  Acute right-sided low back pain without sciatica     Discharge Instructions      BACK PAIN: Stressed avoiding painful activities . RICE (REST, ICE, COMPRESSION, ELEVATION) guidelines reviewed. May alternate ice and heat. Consider use of muscle rubs, Salonpas patches, etc. Use medications as directed including muscle relaxers if prescribed. Take anti-inflammatory medications as prescribed or OTC NSAIDs/Tylenol.  F/u with PCP in 7-10 days for reexamination, and please feel free to call or return to the urgent care at any time for any questions or concerns you may have and we will be happy to help you!   BACK PAIN RED FLAGS: If the back pain acutely worsens or there are any red flag symptoms such as numbness/tingling, leg weakness, saddle anesthesia, or loss of bowel/bladder control, go immediately to the ER. Follow up with [place] as scheduled or sooner if the pain does not begin to resolve or if it worsens before the follow up       ED Prescriptions     Medication Sig Dispense Auth. Provider   traMADol (ULTRAM) 50 MG tablet Take 1 tablet (50 mg total) by mouth every 6 (six) hours as needed for up to 3 days for severe pain. 9 tablet Shirlee Latch, PA-C      I have reviewed the PDMP during this encounter.   Shirlee Latch, PA-C 07/02/22 1517

## 2022-07-12 ENCOUNTER — Encounter: Payer: Self-pay | Admitting: Cardiovascular Disease

## 2022-07-12 ENCOUNTER — Ambulatory Visit (INDEPENDENT_AMBULATORY_CARE_PROVIDER_SITE_OTHER): Payer: Medicare HMO | Admitting: Cardiovascular Disease

## 2022-07-12 VITALS — BP 126/70 | HR 61 | Ht 60.0 in | Wt 177.8 lb

## 2022-07-12 DIAGNOSIS — I10 Essential (primary) hypertension: Secondary | ICD-10-CM | POA: Diagnosis not present

## 2022-07-12 DIAGNOSIS — E782 Mixed hyperlipidemia: Secondary | ICD-10-CM

## 2022-07-12 DIAGNOSIS — I340 Nonrheumatic mitral (valve) insufficiency: Secondary | ICD-10-CM | POA: Diagnosis not present

## 2022-07-12 DIAGNOSIS — I5189 Other ill-defined heart diseases: Secondary | ICD-10-CM | POA: Insufficient documentation

## 2022-07-12 NOTE — Progress Notes (Signed)
Cardiology Office Note   Date:  07/12/2022   ID:  Cheryl Saunders, DOB 1945-11-10, MRN [id number]  PCP:  Sherrie Mustache, MD  Cardiologist:  Adrian Blackwater, MD      History of Present Illness: Cheryl Saunders is a 78 y.o. female who presents for  Chief Complaint  Patient presents with   Follow-up    3 month follow up    Patient in office for routine cardiac exam. Denies chest pain, shortness of breath, edema, palpitations.     Past Medical History:  Diagnosis Date   Diabetes mellitus without complication (HCC)    GERD (gastroesophageal reflux disease)    Heart murmur    followed by PCP   Hypertension    Sleep apnea    supposed to use CPAP. Does not.     Past Surgical History:  Procedure Laterality Date   ABDOMINAL HYSTERECTOMY     BREAST BIOPSY Right 11/22/2016    coil clip - PASH   CATARACT EXTRACTION W/ INTRAOCULAR LENS  IMPLANT, BILATERAL  2022   CHOLECYSTECTOMY     COLONOSCOPY WITH PROPOFOL N/A 10/17/2015   Procedure: COLONOSCOPY WITH PROPOFOL;  Surgeon: Midge Minium, MD;  Location: [facility];  Service: Endoscopy;  Laterality: N/A;  Diabetic - oral meds Sleep apnea   COLONOSCOPY WITH PROPOFOL N/A 10/03/2020   Procedure: COLONOSCOPY WITH PROPOFOL;  Surgeon: Midge Minium, MD;  Location: [facility];  Service: Endoscopy;  Laterality: N/A;  Diabetic   HERNIA REPAIR     POLYPECTOMY  10/17/2015   Procedure: POLYPECTOMY;  Surgeon: Midge Minium, MD;  Location: [facility];  Service: Endoscopy;;  Ascending colon polyps x 2   POLYPECTOMY  10/03/2020   Procedure: POLYPECTOMY;  Surgeon: Midge Minium, MD;  Location: [facility];  Service: Endoscopy;;   REPLACEMENT TOTAL KNEE Bilateral      Current Outpatient Medications  Medication Sig Dispense Refill   acetaminophen (TYLENOL) 500 MG tablet Take 1 tablet (500 mg total) by mouth every 6 (six) hours as needed. 30 tablet 0   amLODipine (NORVASC) 10 MG tablet Take 10 mg by mouth daily.      aspirin 81 MG tablet Take 81 mg by mouth daily.     benazepril (LOTENSIN) 40 MG tablet Take 40 mg by mouth daily.     chlorthalidone (HYGROTON) 25 MG tablet Take 25 mg by mouth daily.     cyanocobalamin (VITAMIN B12) 1000 MCG tablet Take 1,000 mcg by mouth daily.     gabapentin (NEURONTIN) 300 MG capsule Take 300 mg by mouth at bedtime.     glipiZIDE (GLUCOTROL XL) 10 MG 24 hr tablet Take 10 mg by mouth 2 (two) times daily.     lovastatin (MEVACOR) 40 MG tablet Take 40 mg by mouth at bedtime.     meloxicam (MOBIC) 7.5 MG tablet Take 7.5 mg by mouth daily.     metFORMIN (GLUCOPHAGE) 1000 MG tablet Take 1,000 mg by mouth 2 (two) times daily with a meal.     metoprolol succinate (TOPROL-XL) 50 MG 24 hr tablet Take 50 mg by mouth daily. Take with or immediately following a meal.     Multiple Vitamin (MULTIVITAMIN) tablet Take 1 tablet by mouth daily.     mupirocin ointment (BACTROBAN) 2 % Apply 1 application topically 2 (two) times daily. 22 g 0   omeprazole (PRILOSEC) 20 MG capsule Take 20 mg by mouth daily.     Semaglutide (OZEMPIC, 1 MG/DOSE, [place]) Inject 2.5 mg  into the skin once a week.     tiZANidine (ZANAFLEX) 2 MG tablet Take 2 mg by mouth at bedtime.     traMADol (ULTRAM) 50 MG tablet Take 50 mg by mouth every 6 (six) hours as needed.     Vitamin D, Ergocalciferol, (DRISDOL) 50000 units CAPS capsule Take 50,000 Units by mouth once a week.  1   No current facility-administered medications for this visit.    Allergies:   Patient has no known allergies.    Social History:   reports that she has never smoked. She has never used smokeless tobacco. She reports that she does not drink alcohol and does not use drugs.   Family History:  family history includes Breast cancer (age of onset: 47) in her sister.    ROS:     Review of Systems  Constitutional: Negative.   HENT: Negative.    Eyes: Negative.   Respiratory: Negative.    Cardiovascular: Negative.   Gastrointestinal: Negative.    Genitourinary: Negative.   Musculoskeletal: Negative.   Skin: Negative.   Neurological: Negative.   Endo/Heme/Allergies: Negative.   Psychiatric/Behavioral: Negative.    All other systems reviewed and are negative.    All other systems are reviewed and negative.    PHYSICAL EXAM: VS:  BP 126/70   Pulse 61   Ht 5' (1.524 m)   Wt 177 lb 12.8 oz (80.6 kg)   SpO2 97%   BMI 34.72 kg/m  , BMI Body mass index is 34.72 kg/m. Last weight:  Wt Readings from Last 3 Encounters:  07/12/22 177 lb 12.8 oz (80.6 kg)  04/13/22 178 lb 3.2 oz (80.8 kg)  03/26/22 179 lb 14.3 oz (81.6 kg)    Physical Exam Constitutional:      Appearance: Normal appearance.  Cardiovascular:     Rate and Rhythm: Normal rate and regular rhythm.     Heart sounds: Normal heart sounds.  Pulmonary:     Effort: Pulmonary effort is normal.     Breath sounds: Normal breath sounds.  Musculoskeletal:     Right lower leg: No edema.     Left lower leg: No edema.  Neurological:     Mental Status: She is alert.      EKG: none today  Recent Labs: No results found for requested labs within last 365 days.    Lipid Panel No results found for: "CHOL", "TRIG", "HDL", "CHOLHDL", "VLDL", "LDLCALC", "LDLDIRECT"    ASSESSMENT AND PLAN:    ICD-10-CM   1. Essential hypertension  I10     2. Mixed hyperlipidemia  E78.2     3. Nonrheumatic mitral valve regurgitation  I34.0     4. Diastolic dysfunction  I51.89        Problem List Items Addressed This Visit       Cardiovascular and Mediastinum   Essential hypertension - Primary    Well controlled on recheck. Continue same medications. Normal EF on 10/2021 echo. Grade I DD.      Nonrheumatic mitral valve regurgitation    Echo 10/2021 mild-mod MVR        Other   Mixed hyperlipidemia   Diastolic dysfunction     Disposition:   Return in about 4 months (around 11/12/2022).    Total time spent: 30 minutes  Signed,  Adrian Blackwater, MD  07/12/2022 12:43  PM    [facility]

## 2022-07-12 NOTE — Assessment & Plan Note (Signed)
Echo 10/2021 mild-mod MVR

## 2022-07-12 NOTE — Assessment & Plan Note (Addendum)
Well controlled on recheck. Continue same medications. Normal EF on 10/2021 echo. Grade I DD.

## 2022-11-13 ENCOUNTER — Ambulatory Visit: Payer: Medicare HMO | Admitting: Cardiovascular Disease

## 2022-11-27 ENCOUNTER — Encounter: Payer: Self-pay | Admitting: Cardiovascular Disease

## 2022-11-27 ENCOUNTER — Ambulatory Visit (INDEPENDENT_AMBULATORY_CARE_PROVIDER_SITE_OTHER): Payer: Medicare HMO | Admitting: Cardiovascular Disease

## 2022-11-27 VITALS — BP 130/70 | HR 67 | Ht 60.0 in | Wt 173.4 lb

## 2022-11-27 DIAGNOSIS — E782 Mixed hyperlipidemia: Secondary | ICD-10-CM

## 2022-11-27 DIAGNOSIS — I340 Nonrheumatic mitral (valve) insufficiency: Secondary | ICD-10-CM

## 2022-11-27 DIAGNOSIS — G4733 Obstructive sleep apnea (adult) (pediatric): Secondary | ICD-10-CM | POA: Diagnosis not present

## 2022-11-27 DIAGNOSIS — I5189 Other ill-defined heart diseases: Secondary | ICD-10-CM

## 2022-11-27 DIAGNOSIS — I10 Essential (primary) hypertension: Secondary | ICD-10-CM

## 2022-11-27 NOTE — Progress Notes (Signed)
Cardiology Office Note   Date:  11/27/2022   ID:  Cheryl Saunders, DOB [birth year], MRN [id number]  PCP:  Sherrie Mustache, MD  Cardiologist:  Adrian Blackwater, MD      History of Present Illness: Cheryl Saunders is a 77 y.o. female who presents for  Chief Complaint  Patient presents with   Follow-up    4 mo f/u    Doing well      Past Medical History:  Diagnosis Date   Diabetes mellitus without complication (HCC)    GERD (gastroesophageal reflux disease)    Heart murmur    followed by PCP   Hypertension    Sleep apnea    supposed to use CPAP. Does not.     Past Surgical History:  Procedure Laterality Date   ABDOMINAL HYSTERECTOMY     BREAST BIOPSY Right 11/22/2016    coil clip - PASH   CATARACT EXTRACTION W/ INTRAOCULAR LENS  IMPLANT, BILATERAL  2022   CHOLECYSTECTOMY     COLONOSCOPY WITH PROPOFOL N/A 10/17/2015   Procedure: COLONOSCOPY WITH PROPOFOL;  Surgeon: Midge Minium, MD;  Location: [facility];  Service: Endoscopy;  Laterality: N/A;  Diabetic - oral meds Sleep apnea   COLONOSCOPY WITH PROPOFOL N/A 10/03/2020   Procedure: COLONOSCOPY WITH PROPOFOL;  Surgeon: Midge Minium, MD;  Location: [facility];  Service: Endoscopy;  Laterality: N/A;  Diabetic   HERNIA REPAIR     POLYPECTOMY  10/17/2015   Procedure: POLYPECTOMY;  Surgeon: Midge Minium, MD;  Location: [facility];  Service: Endoscopy;;  Ascending colon polyps x 2   POLYPECTOMY  10/03/2020   Procedure: POLYPECTOMY;  Surgeon: Midge Minium, MD;  Location: [facility];  Service: Endoscopy;;   REPLACEMENT TOTAL KNEE Bilateral      Current Outpatient Medications  Medication Sig Dispense Refill   acetaminophen (TYLENOL) 500 MG tablet Take 1 tablet (500 mg total) by mouth every 6 (six) hours as needed. 30 tablet 0   amLODipine (NORVASC) 10 MG tablet Take 10 mg by mouth daily.     aspirin 81 MG tablet Take 81 mg by mouth daily.     benazepril (LOTENSIN) 40 MG tablet Take 40 mg by  mouth daily.     chlorthalidone (HYGROTON) 25 MG tablet Take 25 mg by mouth daily.     cyanocobalamin (VITAMIN B12) 1000 MCG tablet Take 1,000 mcg by mouth daily.     gabapentin (NEURONTIN) 300 MG capsule Take 300 mg by mouth at bedtime.     glipiZIDE (GLUCOTROL XL) 10 MG 24 hr tablet Take 10 mg by mouth 2 (two) times daily.     lovastatin (MEVACOR) 40 MG tablet Take 40 mg by mouth at bedtime.     meloxicam (MOBIC) 7.5 MG tablet Take 7.5 mg by mouth daily.     metFORMIN (GLUCOPHAGE) 1000 MG tablet Take 1,000 mg by mouth 2 (two) times daily with a meal.     metoprolol succinate (TOPROL-XL) 50 MG 24 hr tablet Take 50 mg by mouth daily. Take with or immediately following a meal.     Multiple Vitamin (MULTIVITAMIN) tablet Take 1 tablet by mouth daily.     mupirocin ointment (BACTROBAN) 2 % Apply 1 application topically 2 (two) times daily. 22 g 0   omeprazole (PRILOSEC) 20 MG capsule Take 20 mg by mouth daily.     Semaglutide (OZEMPIC, 1 MG/DOSE, [place]) Inject 2.5 mg into the skin once a week.     tiZANidine (ZANAFLEX) 2  MG tablet Take 2 mg by mouth at bedtime.     traMADol (ULTRAM) 50 MG tablet Take 50 mg by mouth every 6 (six) hours as needed.     Vitamin D, Ergocalciferol, (DRISDOL) 50000 units CAPS capsule Take 50,000 Units by mouth once a week.  1   No current facility-administered medications for this visit.    Allergies:   Patient has no known allergies.    Social History:   reports that she has never smoked. She has never used smokeless tobacco. She reports that she does not drink alcohol and does not use drugs.   Family History:  family history includes Breast cancer (age of onset: 30) in her sister.    ROS:     Review of Systems  Constitutional: Negative.   HENT: Negative.    Eyes: Negative.   Respiratory: Negative.    Gastrointestinal: Negative.   Genitourinary: Negative.   Musculoskeletal: Negative.   Skin: Negative.   Neurological: Negative.   Endo/Heme/Allergies:  Negative.   Psychiatric/Behavioral: Negative.    All other systems reviewed and are negative.     All other systems are reviewed and negative.    PHYSICAL EXAM: VS:  BP 130/70   Pulse 67   Ht 5' (1.524 m)   Wt 173 lb 6.4 oz (78.7 kg)   SpO2 98%   BMI 33.86 kg/m  , BMI Body mass index is 33.86 kg/m. Last weight:  Wt Readings from Last 3 Encounters:  11/27/22 173 lb 6.4 oz (78.7 kg)  07/12/22 177 lb 12.8 oz (80.6 kg)  04/13/22 178 lb 3.2 oz (80.8 kg)     Physical Exam    EKG:   Recent Labs: No results found for requested labs within last 365 days.    Lipid Panel No results found for: "CHOL", "TRIG", "HDL", "CHOLHDL", "VLDL", "LDLCALC", "LDLDIRECT"    Other studies Reviewed: Additional studies/ records that were reviewed today include:  Review of the above records demonstrates:       No data to display            ASSESSMENT AND PLAN:    ICD-10-CM   1. Diastolic dysfunction  I51.89 PCV ECHOCARDIOGRAM COMPLETE   doing well, repeat echo    2. Nonrheumatic mitral valve regurgitation  I34.0 PCV ECHOCARDIOGRAM COMPLETE    3. Essential hypertension  I10 PCV ECHOCARDIOGRAM COMPLETE    4. OSA (obstructive sleep apnea)  G47.33 PCV ECHOCARDIOGRAM COMPLETE    5. Mixed hyperlipidemia  E78.2 PCV ECHOCARDIOGRAM COMPLETE       Problem List Items Addressed This Visit       Cardiovascular and Mediastinum   Essential hypertension   Relevant Orders   PCV ECHOCARDIOGRAM COMPLETE   Nonrheumatic mitral valve regurgitation   Relevant Orders   PCV ECHOCARDIOGRAM COMPLETE     Respiratory   OSA (obstructive sleep apnea)   Relevant Orders   PCV ECHOCARDIOGRAM COMPLETE     Other   Mixed hyperlipidemia   Relevant Orders   PCV ECHOCARDIOGRAM COMPLETE   Diastolic dysfunction - Primary   Relevant Orders   PCV ECHOCARDIOGRAM COMPLETE       Disposition:   Return in about 2 months (around 01/27/2023) for echo, and f/u.    Total time spent: 35  minutes  Signed,  Adrian Blackwater, MD  11/27/2022 10:27 AM    [facility]

## 2023-01-08 ENCOUNTER — Other Ambulatory Visit: Payer: Self-pay | Admitting: Internal Medicine

## 2023-01-08 DIAGNOSIS — Z1231 Encounter for screening mammogram for malignant neoplasm of breast: Secondary | ICD-10-CM

## 2023-01-28 ENCOUNTER — Ambulatory Visit (INDEPENDENT_AMBULATORY_CARE_PROVIDER_SITE_OTHER): Payer: Medicare HMO

## 2023-01-28 DIAGNOSIS — I340 Nonrheumatic mitral (valve) insufficiency: Secondary | ICD-10-CM

## 2023-01-28 DIAGNOSIS — I351 Nonrheumatic aortic (valve) insufficiency: Secondary | ICD-10-CM | POA: Diagnosis not present

## 2023-01-28 DIAGNOSIS — I10 Essential (primary) hypertension: Secondary | ICD-10-CM

## 2023-01-28 DIAGNOSIS — I422 Other hypertrophic cardiomyopathy: Secondary | ICD-10-CM | POA: Diagnosis not present

## 2023-01-28 DIAGNOSIS — E782 Mixed hyperlipidemia: Secondary | ICD-10-CM

## 2023-01-28 DIAGNOSIS — I5189 Other ill-defined heart diseases: Secondary | ICD-10-CM

## 2023-01-28 DIAGNOSIS — G4733 Obstructive sleep apnea (adult) (pediatric): Secondary | ICD-10-CM

## 2023-01-29 ENCOUNTER — Ambulatory Visit
Admission: RE | Admit: 2023-01-29 | Discharge: 2023-01-29 | Disposition: A | Discharge status: Home / Self Care | Payer: Medicare HMO | Source: Ambulatory Visit | Attending: Internal Medicine | Admitting: Internal Medicine

## 2023-01-29 DIAGNOSIS — Z1231 Encounter for screening mammogram for malignant neoplasm of breast: Secondary | ICD-10-CM | POA: Insufficient documentation

## 2023-02-01 ENCOUNTER — Ambulatory Visit: Payer: Medicare HMO | Admitting: Cardiovascular Disease

## 2023-02-04 ENCOUNTER — Ambulatory Visit (INDEPENDENT_AMBULATORY_CARE_PROVIDER_SITE_OTHER): Payer: Medicare HMO | Admitting: Cardiovascular Disease

## 2023-02-04 ENCOUNTER — Encounter: Payer: Self-pay | Admitting: Cardiovascular Disease

## 2023-02-04 VITALS — BP 154/78 | HR 72 | Ht 60.0 in | Wt 171.2 lb

## 2023-02-04 DIAGNOSIS — I10 Essential (primary) hypertension: Secondary | ICD-10-CM

## 2023-02-04 DIAGNOSIS — E782 Mixed hyperlipidemia: Secondary | ICD-10-CM

## 2023-02-04 DIAGNOSIS — I5189 Other ill-defined heart diseases: Secondary | ICD-10-CM | POA: Diagnosis not present

## 2023-02-04 DIAGNOSIS — G4733 Obstructive sleep apnea (adult) (pediatric): Secondary | ICD-10-CM | POA: Diagnosis not present

## 2023-02-04 DIAGNOSIS — R0602 Shortness of breath: Secondary | ICD-10-CM

## 2023-02-04 NOTE — Progress Notes (Signed)
Cardiology Office Note   Date:  02/04/2023   ID:  MALYAH SARPY, DOB 20-Jul-1945, MRN [id number]  PCP:  Sherrie Mustache, MD  Cardiologist:  Adrian Blackwater, MD      History of Present Illness: Cheryl Saunders is a 77 y.o. female who presents for  Chief Complaint  Patient presents with   Follow-up    Echo results    Doing well, did not take meds as bp high      Past Medical History:  Diagnosis Date   Diabetes mellitus without complication (HCC)    GERD (gastroesophageal reflux disease)    Heart murmur    followed by PCP   Hypertension    Sleep apnea    supposed to use CPAP. Does not.     Past Surgical History:  Procedure Laterality Date   ABDOMINAL HYSTERECTOMY     BREAST BIOPSY Right 11/22/2016    coil clip - PASH   CATARACT EXTRACTION W/ INTRAOCULAR LENS  IMPLANT, BILATERAL  2022   CHOLECYSTECTOMY     COLONOSCOPY WITH PROPOFOL N/A 10/17/2015   Procedure: COLONOSCOPY WITH PROPOFOL;  Surgeon: Midge Minium, MD;  Location: [facility];  Service: Endoscopy;  Laterality: N/A;  Diabetic - oral meds Sleep apnea   COLONOSCOPY WITH PROPOFOL N/A 10/03/2020   Procedure: COLONOSCOPY WITH PROPOFOL;  Surgeon: Midge Minium, MD;  Location: [facility];  Service: Endoscopy;  Laterality: N/A;  Diabetic   HERNIA REPAIR     POLYPECTOMY  10/17/2015   Procedure: POLYPECTOMY;  Surgeon: Midge Minium, MD;  Location: [facility];  Service: Endoscopy;;  Ascending colon polyps x 2   POLYPECTOMY  10/03/2020   Procedure: POLYPECTOMY;  Surgeon: Midge Minium, MD;  Location: [facility];  Service: Endoscopy;;   REPLACEMENT TOTAL KNEE Bilateral      Current Outpatient Medications  Medication Sig Dispense Refill   acetaminophen (TYLENOL) 500 MG tablet Take 1 tablet (500 mg total) by mouth every 6 (six) hours as needed. 30 tablet 0   amLODipine (NORVASC) 10 MG tablet Take 10 mg by mouth daily.     aspirin 81 MG tablet Take 81 mg by mouth daily.     benazepril  (LOTENSIN) 40 MG tablet Take 40 mg by mouth daily.     chlorthalidone (HYGROTON) 25 MG tablet Take 25 mg by mouth daily.     FARXIGA 10 MG TABS tablet Take 10 mg by mouth daily.     gabapentin (NEURONTIN) 300 MG capsule Take 300 mg by mouth at bedtime.     glipiZIDE (GLUCOTROL XL) 10 MG 24 hr tablet Take 10 mg by mouth 2 (two) times daily.     lovastatin (MEVACOR) 40 MG tablet Take 40 mg by mouth at bedtime.     metFORMIN (GLUCOPHAGE) 1000 MG tablet Take 1,000 mg by mouth 2 (two) times daily with a meal.     metoprolol succinate (TOPROL-XL) 50 MG 24 hr tablet Take 50 mg by mouth daily. Take with or immediately following a meal.     Multiple Vitamin (MULTIVITAMIN) tablet Take 1 tablet by mouth daily.     omeprazole (PRILOSEC) 20 MG capsule Take 20 mg by mouth daily.     Semaglutide (OZEMPIC, 1 MG/DOSE, ) Inject 2.5 mg into the skin once a week.     tiZANidine (ZANAFLEX) 2 MG tablet Take 2 mg by mouth at bedtime.     Vitamin D, Ergocalciferol, (DRISDOL) 50000 units CAPS capsule Take 50,000 Units by mouth once a  week.  1   cyanocobalamin (VITAMIN B12) 1000 MCG tablet Take 1,000 mcg by mouth daily. (Patient not taking: Reported on 02/04/2023)     meloxicam (MOBIC) 7.5 MG tablet Take 7.5 mg by mouth daily. (Patient not taking: Reported on 02/04/2023)     mupirocin ointment (BACTROBAN) 2 % Apply 1 application topically 2 (two) times daily. (Patient not taking: Reported on 02/04/2023) 22 g 0   No current facility-administered medications for this visit.    Allergies:   Patient has no known allergies.    Social History:   reports that she has never smoked. She has never used smokeless tobacco. She reports that she does not drink alcohol and does not use drugs.   Family History:  family history includes Breast cancer (age of onset: 53) in her sister.    ROS:     Review of Systems  Constitutional: Negative.   HENT: Negative.    Eyes: Negative.   Respiratory: Negative.    Gastrointestinal:  Negative.   Genitourinary: Negative.   Musculoskeletal: Negative.   Skin: Negative.   Neurological: Negative.   Endo/Heme/Allergies: Negative.   Psychiatric/Behavioral: Negative.    All other systems reviewed and are negative.     All other systems are reviewed and negative.    PHYSICAL EXAM: VS:  BP (!) 154/78   Pulse 72   Ht 5' (1.524 m)   Wt 171 lb 3.2 oz (77.7 kg)   SpO2 98%   BMI 33.44 kg/m  , BMI Body mass index is 33.44 kg/m. Last weight:  Wt Readings from Last 3 Encounters:  02/04/23 171 lb 3.2 oz (77.7 kg)  11/27/22 173 lb 6.4 oz (78.7 kg)  07/12/22 177 lb 12.8 oz (80.6 kg)     Physical Exam Constitutional:      Appearance: Normal appearance.  Cardiovascular:     Rate and Rhythm: Normal rate and regular rhythm.     Heart sounds: Normal heart sounds.  Pulmonary:     Effort: Pulmonary effort is normal.     Breath sounds: Normal breath sounds.  Musculoskeletal:     Right lower leg: No edema.     Left lower leg: No edema.  Neurological:     Mental Status: She is alert.       EKG:   Recent Labs: No results found for requested labs within last 365 days.    Lipid Panel No results found for: "CHOL", "TRIG", "HDL", "CHOLHDL", "VLDL", "LDLCALC", "LDLDIRECT"    Other studies Reviewed: Additional studies/ records that were reviewed today include:  Review of the above records demonstrates:       No data to display            ASSESSMENT AND PLAN:    ICD-10-CM   1. Essential hypertension  I10    Normal LVEf, diastolic dysfunction, added farxiga    2. OSA (obstructive sleep apnea)  G47.33     3. Mixed hyperlipidemia  E78.2     4. Diastolic dysfunction  I51.89     5. SOB (shortness of breath)  R06.02        Problem List Items Addressed This Visit       Cardiovascular and Mediastinum   Essential hypertension - Primary     Respiratory   OSA (obstructive sleep apnea)     Other   Mixed hyperlipidemia   Diastolic dysfunction    Other Visit Diagnoses     SOB (shortness of breath)  Disposition:   Return in about 3 months (around 05/05/2023).    Total time spent: 30 minutes  Signed,  Adrian Blackwater, MD  02/04/2023 10:56 AM    [facility]

## 2023-04-04 ENCOUNTER — Encounter: Payer: Self-pay | Admitting: Internal Medicine

## 2023-05-06 ENCOUNTER — Encounter: Payer: Self-pay | Admitting: Cardiovascular Disease

## 2023-05-06 ENCOUNTER — Ambulatory Visit (INDEPENDENT_AMBULATORY_CARE_PROVIDER_SITE_OTHER): Payer: Medicare HMO | Admitting: Cardiovascular Disease

## 2023-05-06 VITALS — BP 116/78 | HR 58 | Ht 60.0 in | Wt 174.0 lb

## 2023-05-06 DIAGNOSIS — G4733 Obstructive sleep apnea (adult) (pediatric): Secondary | ICD-10-CM | POA: Diagnosis not present

## 2023-05-06 DIAGNOSIS — I10 Essential (primary) hypertension: Secondary | ICD-10-CM | POA: Diagnosis not present

## 2023-05-06 DIAGNOSIS — I340 Nonrheumatic mitral (valve) insufficiency: Secondary | ICD-10-CM

## 2023-05-06 DIAGNOSIS — E782 Mixed hyperlipidemia: Secondary | ICD-10-CM | POA: Diagnosis not present

## 2023-05-06 DIAGNOSIS — I5189 Other ill-defined heart diseases: Secondary | ICD-10-CM

## 2023-05-06 DIAGNOSIS — R0602 Shortness of breath: Secondary | ICD-10-CM

## 2023-05-06 NOTE — Progress Notes (Signed)
 Cardiology Office Note   Date:  05/06/2023   ID:  Cheryl Saunders, DOB 10/12/1945, MRN [id number]  PCP:  Sherrie Mustache, MD  Cardiologist:  Adrian Blackwater, MD      History of Present Illness: Cheryl Saunders is a 78 y.o. female who presents for  Chief Complaint  Patient presents with   Follow-up    3 month follow up     Doing fine      Past Medical History:  Diagnosis Date   Diabetes mellitus without complication (HCC)    GERD (gastroesophageal reflux disease)    Heart murmur    followed by PCP   Hypertension    Sleep apnea    supposed to use CPAP. Does not.     Past Surgical History:  Procedure Laterality Date   ABDOMINAL HYSTERECTOMY     BREAST BIOPSY Right 11/22/2016    coil clip - PASH   CATARACT EXTRACTION W/ INTRAOCULAR LENS  IMPLANT, BILATERAL  2022   CHOLECYSTECTOMY     COLONOSCOPY WITH PROPOFOL N/A 10/17/2015   Procedure: COLONOSCOPY WITH PROPOFOL;  Surgeon: Midge Minium, MD;  Location: [facility];  Service: Endoscopy;  Laterality: N/A;  Diabetic - oral meds Sleep apnea   COLONOSCOPY WITH PROPOFOL N/A 10/03/2020   Procedure: COLONOSCOPY WITH PROPOFOL;  Surgeon: Midge Minium, MD;  Location: [facility];  Service: Endoscopy;  Laterality: N/A;  Diabetic   HERNIA REPAIR     POLYPECTOMY  10/17/2015   Procedure: POLYPECTOMY;  Surgeon: Midge Minium, MD;  Location: [facility];  Service: Endoscopy;;  Ascending colon polyps x 2   POLYPECTOMY  10/03/2020   Procedure: POLYPECTOMY;  Surgeon: Midge Minium, MD;  Location: [facility];  Service: Endoscopy;;   REPLACEMENT TOTAL KNEE Bilateral      Current Outpatient Medications  Medication Sig Dispense Refill   acetaminophen (TYLENOL) 500 MG tablet Take 1 tablet (500 mg total) by mouth every 6 (six) hours as needed. 30 tablet 0   amLODipine (NORVASC) 10 MG tablet Take 10 mg by mouth daily.     aspirin 81 MG tablet Take 81 mg by mouth daily.     benazepril (LOTENSIN) 40 MG tablet Take 40  mg by mouth daily.     chlorthalidone (HYGROTON) 25 MG tablet Take 25 mg by mouth daily.     cyanocobalamin (VITAMIN B12) 1000 MCG tablet Take 1,000 mcg by mouth daily. (Patient not taking: Reported on 02/04/2023)     FARXIGA 10 MG TABS tablet Take 10 mg by mouth daily.     gabapentin (NEURONTIN) 300 MG capsule Take 300 mg by mouth at bedtime.     glipiZIDE (GLUCOTROL XL) 10 MG 24 hr tablet Take 10 mg by mouth 2 (two) times daily.     lovastatin (MEVACOR) 40 MG tablet Take 40 mg by mouth at bedtime.     meloxicam (MOBIC) 7.5 MG tablet Take 7.5 mg by mouth daily. (Patient not taking: Reported on 02/04/2023)     metFORMIN (GLUCOPHAGE) 1000 MG tablet Take 1,000 mg by mouth 2 (two) times daily with a meal.     metoprolol succinate (TOPROL-XL) 50 MG 24 hr tablet Take 50 mg by mouth daily. Take with or immediately following a meal.     Multiple Vitamin (MULTIVITAMIN) tablet Take 1 tablet by mouth daily.     mupirocin ointment (BACTROBAN) 2 % Apply 1 application topically 2 (two) times daily. (Patient not taking: Reported on 02/04/2023) 22 g 0   omeprazole (  PRILOSEC) 20 MG capsule Take 20 mg by mouth daily.     Semaglutide (OZEMPIC, 1 MG/DOSE, [place]) Inject 2.5 mg into the skin once a week.     tiZANidine (ZANAFLEX) 2 MG tablet Take 2 mg by mouth at bedtime.     Vitamin D, Ergocalciferol, (DRISDOL) 50000 units CAPS capsule Take 50,000 Units by mouth once a week.  1   No current facility-administered medications for this visit.    Allergies:   Patient has no known allergies.    Social History:   reports that she has never smoked. She has never used smokeless tobacco. She reports that she does not drink alcohol and does not use drugs.   Family History:  family history includes Breast cancer (age of onset: 75) in her sister.    ROS:     Review of Systems  Constitutional: Negative.   HENT: Negative.    Eyes: Negative.   Respiratory: Negative.    Gastrointestinal: Negative.   Genitourinary:  Negative.   Musculoskeletal: Negative.   Skin: Negative.   Neurological: Negative.   Endo/Heme/Allergies: Negative.   Psychiatric/Behavioral: Negative.    All other systems reviewed and are negative.     All other systems are reviewed and negative.    PHYSICAL EXAM: VS:  BP 116/78   Pulse (!) 58   Ht 5' (1.524 m)   Wt 174 lb (78.9 kg)   SpO2 98%   BMI 33.98 kg/m  , BMI Body mass index is 33.98 kg/m. Last weight:  Wt Readings from Last 3 Encounters:  05/06/23 174 lb (78.9 kg)  02/04/23 171 lb 3.2 oz (77.7 kg)  11/27/22 173 lb 6.4 oz (78.7 kg)     Physical Exam Constitutional:      Appearance: Normal appearance.  Cardiovascular:     Rate and Rhythm: Normal rate and regular rhythm.     Heart sounds: Normal heart sounds.  Pulmonary:     Effort: Pulmonary effort is normal.     Breath sounds: Normal breath sounds.  Musculoskeletal:     Right lower leg: No edema.     Left lower leg: No edema.  Neurological:     Mental Status: She is alert.       EKG:   Recent Labs: No results found for requested labs within last 365 days.    Lipid Panel No results found for: "CHOL", "TRIG", "HDL", "CHOLHDL", "VLDL", "LDLCALC", "LDLDIRECT"    Other studies Reviewed: Additional studies/ records that were reviewed today include:  Review of the above records demonstrates:       No data to display            ASSESSMENT AND PLAN:    ICD-10-CM   1. Essential hypertension  I10     2. OSA (obstructive sleep apnea)  G47.33     3. Mixed hyperlipidemia  E78.2     4. Nonrheumatic mitral valve regurgitation  I34.0     5. SOB (shortness of breath)  R06.02    SOB resolved    6. Diastolic dysfunction  I51.89        Problem List Items Addressed This Visit       Cardiovascular and Mediastinum   Essential hypertension - Primary   Nonrheumatic mitral valve regurgitation     Respiratory   OSA (obstructive sleep apnea)     Other   Mixed hyperlipidemia   Diastolic  dysfunction   Other Visit Diagnoses       SOB (shortness of breath)  SOB resolved          Disposition:   Return in about 3 months (around 08/06/2023).    Total time spent: 30 minutes  Signed,  Adrian Blackwater, MD  05/06/2023 10:00 AM    [facility]

## 2023-08-06 ENCOUNTER — Encounter: Payer: Self-pay | Admitting: Cardiovascular Disease

## 2023-08-06 ENCOUNTER — Ambulatory Visit (INDEPENDENT_AMBULATORY_CARE_PROVIDER_SITE_OTHER): Admitting: Cardiovascular Disease

## 2023-08-06 VITALS — BP 146/68 | HR 71 | Ht 60.0 in | Wt 172.0 lb

## 2023-08-06 DIAGNOSIS — G4733 Obstructive sleep apnea (adult) (pediatric): Secondary | ICD-10-CM

## 2023-08-06 DIAGNOSIS — R0602 Shortness of breath: Secondary | ICD-10-CM

## 2023-08-06 DIAGNOSIS — I340 Nonrheumatic mitral (valve) insufficiency: Secondary | ICD-10-CM

## 2023-08-06 DIAGNOSIS — I5189 Other ill-defined heart diseases: Secondary | ICD-10-CM

## 2023-08-06 DIAGNOSIS — I10 Essential (primary) hypertension: Secondary | ICD-10-CM | POA: Diagnosis not present

## 2023-08-06 DIAGNOSIS — E782 Mixed hyperlipidemia: Secondary | ICD-10-CM | POA: Diagnosis not present

## 2023-08-06 NOTE — Progress Notes (Signed)
 Cardiology Office Note   Date:  08/06/2023   ID:  Cheryl Saunders, DOB [birth year], MRN [id number]  PCP:  Annelle Kiel, MD  Cardiologist:  Debborah Fairly, MD      History of Present Illness: Cheryl Saunders is a 78 y.o. female who presents for  Chief Complaint  Patient presents with   Follow-up    3 Months Follow Up    No SOB, or chest pains. Feels tired sometimes.      Past Medical History:  Diagnosis Date   Diabetes mellitus without complication (HCC)    GERD (gastroesophageal reflux disease)    Heart murmur    followed by PCP   Hypertension    Sleep apnea    supposed to use CPAP. Does not.     Past Surgical History:  Procedure Laterality Date   ABDOMINAL HYSTERECTOMY     BREAST BIOPSY Right 11/22/2016    coil clip - PASH   CATARACT EXTRACTION W/ INTRAOCULAR LENS  IMPLANT, BILATERAL  2022   CHOLECYSTECTOMY     COLONOSCOPY WITH PROPOFOL  N/A 10/17/2015   Procedure: COLONOSCOPY WITH PROPOFOL ;  Surgeon: Marnee Sink, MD;  Location: [facility];  Service: Endoscopy;  Laterality: N/A;  Diabetic - oral meds Sleep apnea   COLONOSCOPY WITH PROPOFOL  N/A 10/03/2020   Procedure: COLONOSCOPY WITH PROPOFOL ;  Surgeon: Marnee Sink, MD;  Location: [facility];  Service: Endoscopy;  Laterality: N/A;  Diabetic   HERNIA REPAIR     POLYPECTOMY  10/17/2015   Procedure: POLYPECTOMY;  Surgeon: Marnee Sink, MD;  Location: [facility];  Service: Endoscopy;;  Ascending colon polyps x 2   POLYPECTOMY  10/03/2020   Procedure: POLYPECTOMY;  Surgeon: Marnee Sink, MD;  Location: [facility];  Service: Endoscopy;;   REPLACEMENT TOTAL KNEE Bilateral      Current Outpatient Medications  Medication Sig Dispense Refill   amLODipine (NORVASC) 10 MG tablet Take 10 mg by mouth daily.     aspirin 81 MG tablet Take 81 mg by mouth daily.     benazepril (LOTENSIN) 40 MG tablet Take 40 mg by mouth daily.     chlorthalidone (HYGROTON) 25 MG tablet Take 25 mg by mouth  daily.     glipiZIDE (GLUCOTROL XL) 10 MG 24 hr tablet Take 10 mg by mouth 2 (two) times daily.     lovastatin (MEVACOR) 40 MG tablet Take 40 mg by mouth at bedtime.     metFORMIN (GLUCOPHAGE) 1000 MG tablet Take 1,000 mg by mouth 2 (two) times daily with a meal.     metoprolol succinate (TOPROL-XL) 50 MG 24 hr tablet Take 50 mg by mouth daily. Take with or immediately following a meal.     Multiple Vitamin (MULTIVITAMIN) tablet Take 1 tablet by mouth daily.     omeprazole (PRILOSEC) 20 MG capsule Take 20 mg by mouth daily.     Semaglutide (OZEMPIC, 1 MG/DOSE, [place]) Inject 2.5 mg into the skin once a week.     acetaminophen  (TYLENOL ) 500 MG tablet Take 1 tablet (500 mg total) by mouth every 6 (six) hours as needed. 30 tablet 0   No current facility-administered medications for this visit.    Allergies:   Patient has no known allergies.    Social History:   reports that she has never smoked. She has never used smokeless tobacco. She reports that she does not drink alcohol and does not use drugs.   Family History:  family history includes Breast cancer (age  of onset: 71) in her sister.    ROS:     Review of Systems  Constitutional: Negative.   HENT: Negative.    Eyes: Negative.   Respiratory: Negative.    Gastrointestinal: Negative.   Genitourinary: Negative.   Musculoskeletal: Negative.   Skin: Negative.   Neurological: Negative.   Endo/Heme/Allergies: Negative.   Psychiatric/Behavioral: Negative.    All other systems reviewed and are negative.     All other systems are reviewed and negative.    PHYSICAL EXAM: VS:  BP (!) 146/68   Pulse 71   Ht 5' (1.524 m)   Wt 172 lb (78 kg)   SpO2 95%   BMI 33.59 kg/m  , BMI Body mass index is 33.59 kg/m. Last weight:  Wt Readings from Last 3 Encounters:  08/06/23 172 lb (78 kg)  05/06/23 174 lb (78.9 kg)  02/04/23 171 lb 3.2 oz (77.7 kg)     Physical Exam Constitutional:      Appearance: Normal appearance.   Cardiovascular:     Rate and Rhythm: Normal rate and regular rhythm.     Heart sounds: Normal heart sounds.  Pulmonary:     Effort: Pulmonary effort is normal.     Breath sounds: Normal breath sounds.  Musculoskeletal:     Right lower leg: No edema.     Left lower leg: No edema.  Neurological:     Mental Status: She is alert.       EKG:   Recent Labs: No results found for requested labs within last 365 days.    Lipid Panel No results found for: "CHOL", "TRIG", "HDL", "CHOLHDL", "VLDL", "LDLCALC", "LDLDIRECT"    Other studies Reviewed: Additional studies/ records that were reviewed today include:  Review of the above records demonstrates:       No data to display            ASSESSMENT AND PLAN:    ICD-10-CM   1. Mixed hyperlipidemia  E78.2     2. OSA (obstructive sleep apnea)  G47.33    Non- compliant    3. Essential hypertension  I10    Elevated., but normally incliding last visit normal    4. Nonrheumatic mitral valve regurgitation  I34.0     5. SOB (shortness of breath)  R06.02    stopped farxiga as felt low energy    6. Diastolic dysfunction  I51.89    last echo had normal diastolic function and creat 1.32, thus its ok stop farxiga       Problem List Items Addressed This Visit       Cardiovascular and Mediastinum   Essential hypertension   Nonrheumatic mitral valve regurgitation     Respiratory   OSA (obstructive sleep apnea)     Other   Mixed hyperlipidemia - Primary   Diastolic dysfunction   Other Visit Diagnoses       SOB (shortness of breath)       stopped farxiga as felt low energy          Disposition:   Return in about 3 months (around 11/06/2023).    Total time spent: 30 minutes  Signed,  Debborah Fairly, MD  08/06/2023 10:40 AM    [facility]

## 2023-10-10 IMAGING — MG MM DIGITAL SCREENING BILAT W/ TOMO AND CAD
8 series · 8 of 24 positions shown · non-contrast
Comparison: Previous exam(s).

CLINICAL DATA: Screening.

EXAM:
DIGITAL SCREENING BILATERAL MAMMOGRAM WITH TOMOSYNTHESIS AND CAD
TECHNIQUE: Bilateral screening digital craniocaudal and mediolateral oblique
mammograms were obtained. Bilateral screening digital breast
tomosynthesis was performed. The images were evaluated with
computer-aided detection.

[R CC synth-2D]
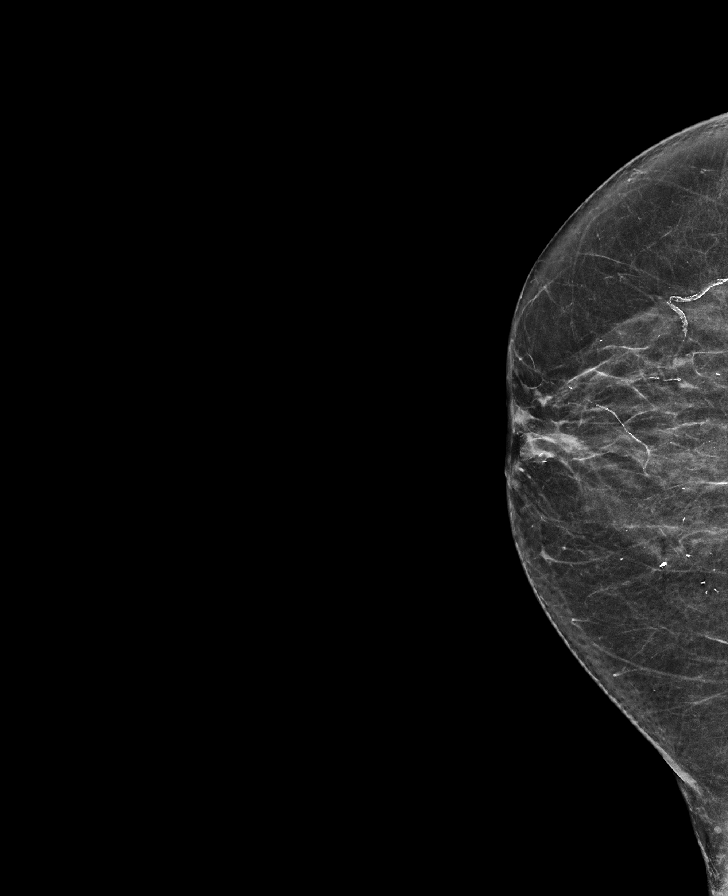

[L CC synth-2D]
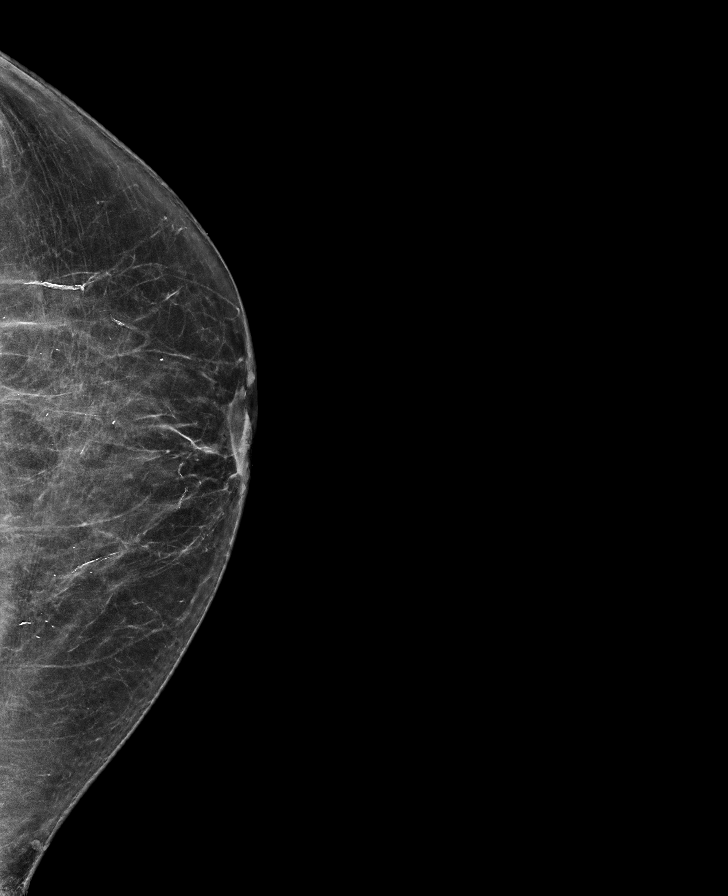

[R MLO synth-2D]
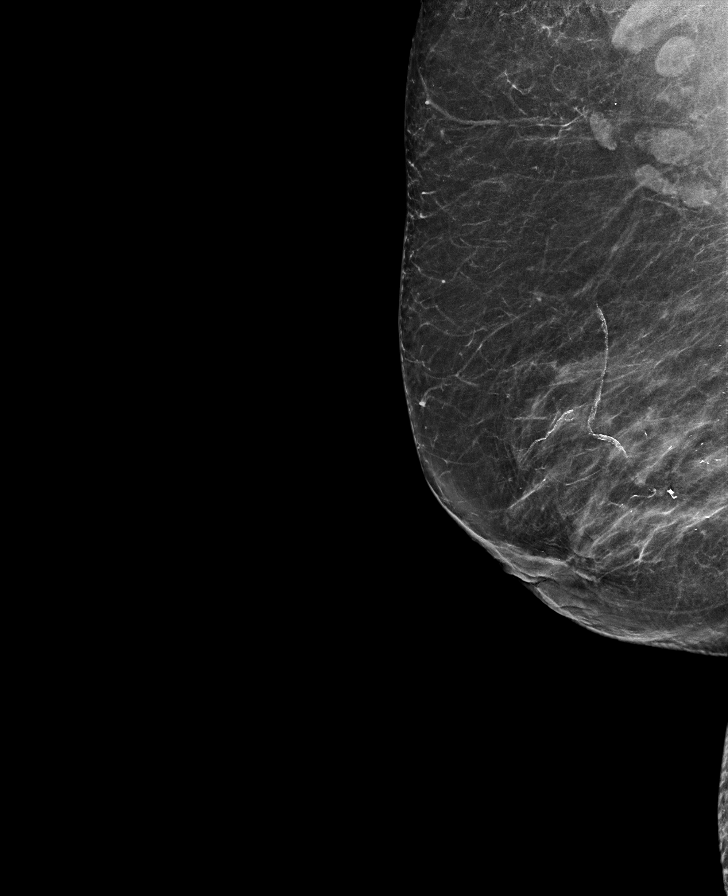

[L MLO synth-2D]
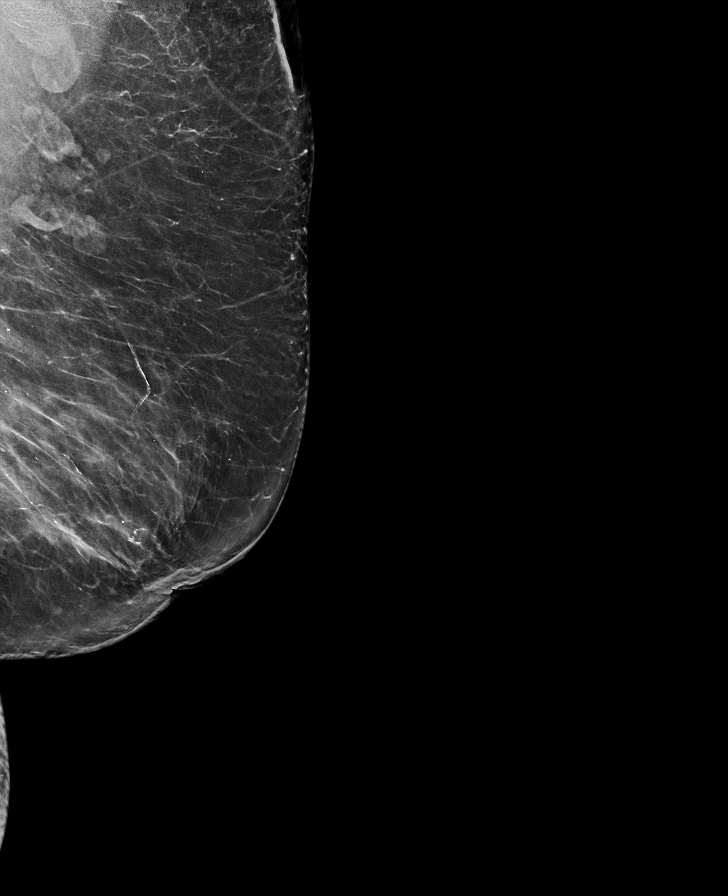

[R CC tomo · tomo slice 33/64.0]
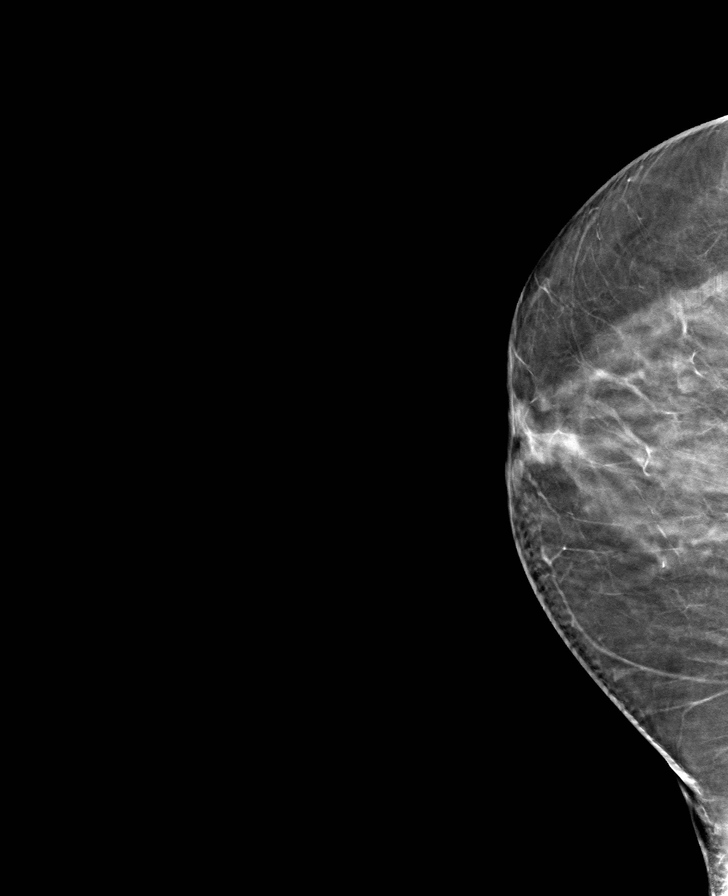

[R MLO tomo · tomo slice 37/72.0]
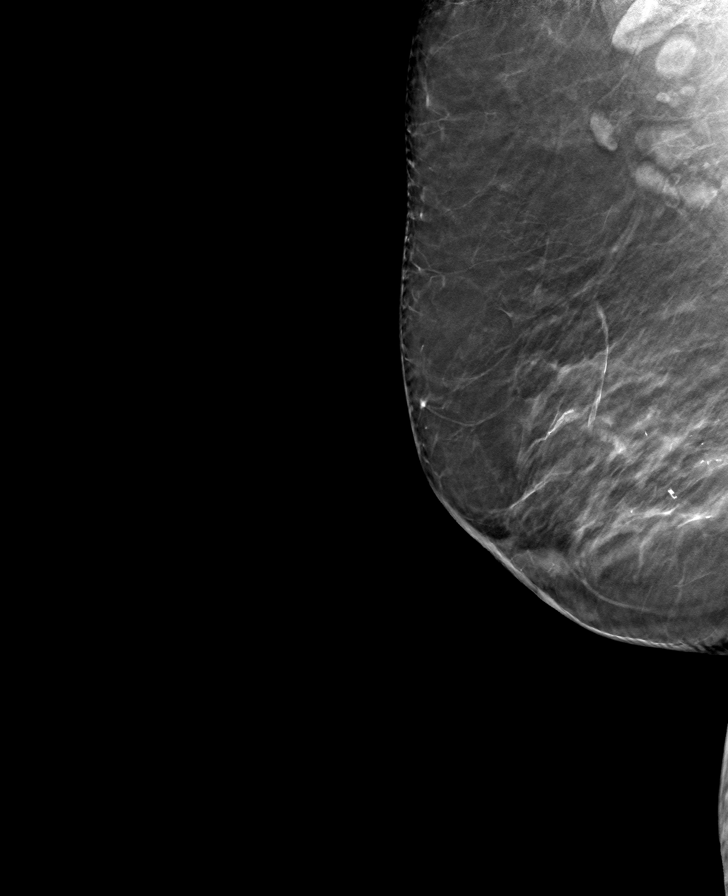

[L MLO tomo · tomo slice 41/82.0]
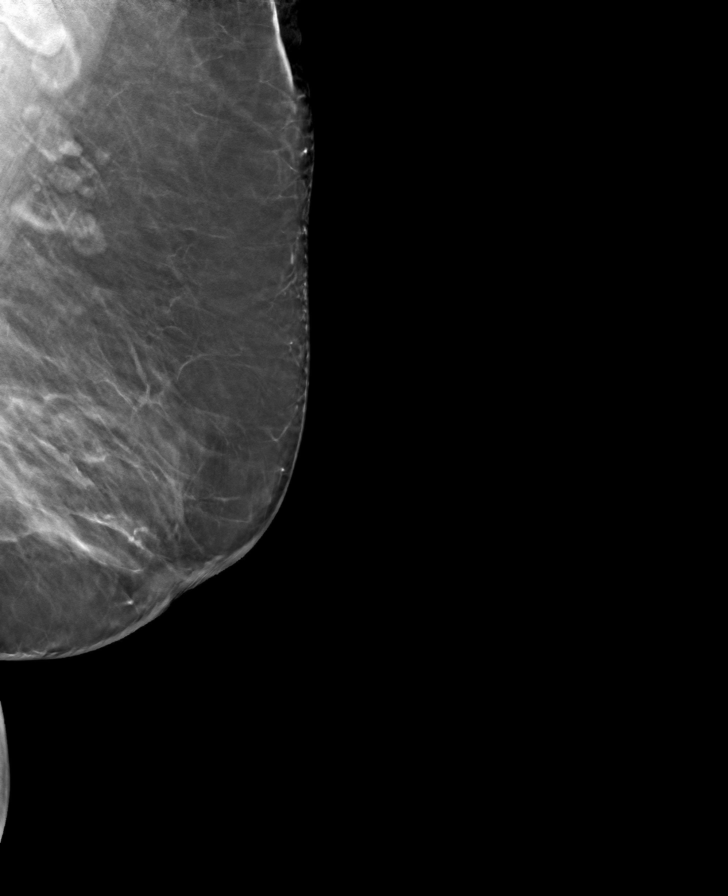

[L CC tomo · tomo slice 33/64.0]
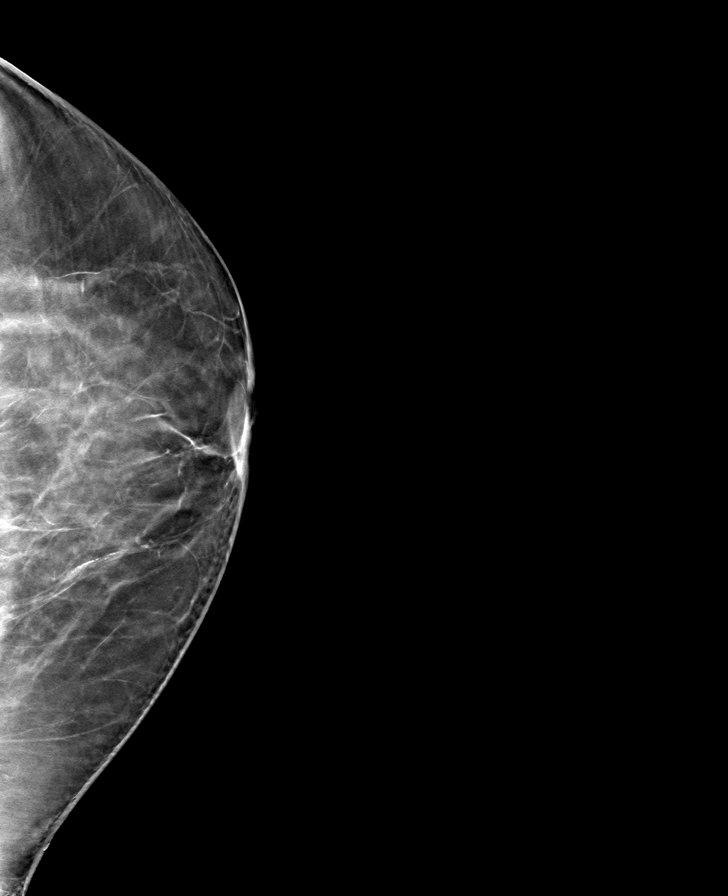

[8 of 24 positions shown; findings below may reference images not displayed]

ACR Breast Density Category b: There are scattered areas of
fibroglandular density.
FINDINGS: There are no findings suspicious for malignancy.
IMPRESSION: No mammographic evidence of malignancy. A result letter of this
screening mammogram will be mailed directly to the patient.

RECOMMENDATION:
Screening mammogram in one year. (Code:51-O-LD2)

BI-RADS CATEGORY  1: Negative.

## 2023-11-08 ENCOUNTER — Encounter: Payer: Self-pay | Admitting: Internal Medicine

## 2023-11-08 ENCOUNTER — Ambulatory Visit (INDEPENDENT_AMBULATORY_CARE_PROVIDER_SITE_OTHER): Admitting: Internal Medicine

## 2023-11-08 VITALS — BP 160/82 | HR 69 | Ht 60.0 in | Wt 176.0 lb

## 2023-11-08 DIAGNOSIS — H0520 Unspecified exophthalmos: Secondary | ICD-10-CM | POA: Diagnosis not present

## 2023-11-08 DIAGNOSIS — E1159 Type 2 diabetes mellitus with other circulatory complications: Secondary | ICD-10-CM | POA: Diagnosis not present

## 2023-11-08 DIAGNOSIS — N1832 Chronic kidney disease, stage 3b: Secondary | ICD-10-CM

## 2023-11-08 DIAGNOSIS — Z23 Encounter for immunization: Secondary | ICD-10-CM | POA: Diagnosis not present

## 2023-11-08 DIAGNOSIS — E1165 Type 2 diabetes mellitus with hyperglycemia: Secondary | ICD-10-CM

## 2023-11-08 DIAGNOSIS — I498 Other specified cardiac arrhythmias: Secondary | ICD-10-CM

## 2023-11-08 DIAGNOSIS — E1169 Type 2 diabetes mellitus with other specified complication: Secondary | ICD-10-CM

## 2023-11-08 DIAGNOSIS — E782 Mixed hyperlipidemia: Secondary | ICD-10-CM | POA: Diagnosis not present

## 2023-11-08 DIAGNOSIS — I152 Hypertension secondary to endocrine disorders: Secondary | ICD-10-CM | POA: Diagnosis not present

## 2023-11-08 DIAGNOSIS — G4733 Obstructive sleep apnea (adult) (pediatric): Secondary | ICD-10-CM

## 2023-11-08 LAB — POCT CBG (FASTING - GLUCOSE)-MANUAL ENTRY: Glucose Fasting, POC: 64 mg/dL — AB (ref 70–99)

## 2023-11-08 MED ORDER — HYDRALAZINE HCL 25 MG PO TABS: 25.0000 mg | ORAL_TABLET | Freq: Two times a day (BID) | ORAL | 3 refills | Status: DC

## 2023-11-08 MED ORDER — COVID-19 MRNA VAC-TRIS(PFIZER) 30 MCG/0.3ML IM SUSY: 0.3000 mL | PREFILLED_SYRINGE | Freq: Once | INTRAMUSCULAR | 0 refills | Status: AC

## 2023-11-08 MED ORDER — ROSUVASTATIN CALCIUM 20 MG PO TABS: 20.0000 mg | ORAL_TABLET | Freq: Every day | ORAL | 3 refills | Status: DC

## 2023-11-08 NOTE — Progress Notes (Signed)
 New Patient Office Visit  Subjective   Patient ID: Cheryl Saunders, female    DOB: Apr 08, 1945  Age: 78 y.o. MRN: [id number]  CC:  Chief Complaint  Patient presents with   Establish Care    NPE     HPI Cheryl Saunders presents to establish care Previous Primary Care provider/office:   she does have additional concerns to discuss today.   Patient comes in to establish PMD.  She has several medical problems which include diabetes, hyper pretension, high cholesterol, and chronic kidney disease.  Today she is generally feeling well except her sugar is low for which she took a candy to help her feel better.  She is due for blood work.  She is under care of nephrologist for chronic kidney disease and subnephrotic proteinuria.  Her most recent labs were done in May 2025 which showed rising creatinine and urinary protein.  She has an appointment coming up with her nephrologist next week.  Will recheck her labs today, and stop metformin for now, she is currently taking 1000 mg once a day.  She is also on Ozempic.  Reports that she was not able to tolerate Farxiga in the past. Her blood pressure is also high at 160/82.  She is currently on Benazepril 40 mg , Amlodipine 20 mg/day, and Chlorthalidone. Will add hydralazine  to 25 mg twice a day. Patient is due for an eye exam and will send referral to [facility].  Her left eye is also more prominent than the right, suspect exophthalmos, will check thyroid profile. She is up-to-date on her mammogram and DEXA scan. She has history of obstructive sleep apnea, but she did not like to use a CPAP machine. Patient reports of myalgias and joint pains from her statin-Mevacor 40 mg/day.  Will switch to Crestor  20 mg daily. Her pulse is irregular, EKG shows sinus arrhythmia.     Outpatient Encounter Medications as of 11/08/2023  Medication Sig   acetaminophen  (TYLENOL ) 500 MG tablet Take 1 tablet (500 mg  total) by mouth every 6 (six) hours as needed.   amLODipine (NORVASC) 10 MG tablet Take 10 mg by mouth daily.   aspirin 81 MG tablet Take 81 mg by mouth daily.   benazepril (LOTENSIN) 40 MG tablet Take 40 mg by mouth daily.   chlorthalidone (HYGROTON) 25 MG tablet Take 25 mg by mouth daily.   COVID-19 mRNA vaccine, Pfizer, (COMIRNATY) syringe Inject 0.3 mLs into the muscle once for 1 dose.   glipiZIDE (GLUCOTROL XL) 10 MG 24 hr tablet Take 10 mg by mouth 2 (two) times daily. (Patient taking differently: Take 10 mg by mouth daily with breakfast.)   hydrALAZINE  (APRESOLINE ) 25 MG tablet Take 1 tablet (25 mg total) by mouth 2 (two) times daily.   metFORMIN (GLUCOPHAGE) 1000 MG tablet Take 1,000 mg by mouth 2 (two) times daily with a meal. (Patient taking differently: Take 1,000 mg by mouth daily with breakfast.)   metoprolol succinate (TOPROL-XL) 50 MG 24 hr tablet Take 50 mg by mouth daily. Take with or immediately following a meal.   omeprazole (PRILOSEC) 20 MG capsule Take 20 mg by mouth daily.   OZEMPIC, 0.25 OR 0.5 MG/DOSE, 2 MG/3ML SOPN SMARTSIG:0.25 Milligram(s) SUB-Q Once a Week   rosuvastatin  (CRESTOR ) 20 MG tablet Take 1 tablet (20 mg total) by mouth daily.   TRUE METRIX BLOOD GLUCOSE TEST test strip SMARTSIG:Via Meter   TRUEplus Lancets 33G MISC SMARTSIG:Lancet Topical   [DISCONTINUED] lovastatin (MEVACOR) 40 MG tablet  Take 40 mg by mouth at bedtime.   [DISCONTINUED] Semaglutide (OZEMPIC, 1 MG/DOSE, [place]) Inject 2.5 mg into the skin once a week. (Patient taking differently: Inject 2 mg into the skin once a week.)   Multiple Vitamin (MULTIVITAMIN) tablet Take 1 tablet by mouth daily. (Patient not taking: Reported on 11/08/2023)   No facility-administered encounter medications on file as of 11/08/2023.    Past Medical History:  Diagnosis Date   Chronic kidney disease (CKD), active medical management without dialysis, stage 3 (moderate) (HCC)    GERD (gastroesophageal reflux disease)     Heart murmur    followed by PCP   Hyperlipidemia associated with type 2 diabetes mellitus (HCC)    Hypertension    Sleep apnea    supposed to use CPAP. Does not.   Type 2 diabetes mellitus with hyperglycemia [facility])     Past Surgical History:  Procedure Laterality Date   ABDOMINAL HYSTERECTOMY     BREAST BIOPSY Right 11/22/2016    coil clip - PASH   CATARACT EXTRACTION W/ INTRAOCULAR LENS  IMPLANT, BILATERAL  2022   CHOLECYSTECTOMY     COLONOSCOPY WITH PROPOFOL  N/A 10/17/2015   Procedure: COLONOSCOPY WITH PROPOFOL ;  Surgeon: Rogelia Copping, MD;  Location: [facility];  Service: Endoscopy;  Laterality: N/A;  Diabetic - oral meds Sleep apnea   COLONOSCOPY WITH PROPOFOL  N/A 10/03/2020   Procedure: COLONOSCOPY WITH PROPOFOL ;  Surgeon: Copping Rogelia, MD;  Location: [facility];  Service: Endoscopy;  Laterality: N/A;  Diabetic   HERNIA REPAIR     POLYPECTOMY  10/17/2015   Procedure: POLYPECTOMY;  Surgeon: Rogelia Copping, MD;  Location: [facility];  Service: Endoscopy;;  Ascending colon polyps x 2   POLYPECTOMY  10/03/2020   Procedure: POLYPECTOMY;  Surgeon: Copping Rogelia, MD;  Location: [facility];  Service: Endoscopy;;   REPLACEMENT TOTAL KNEE Bilateral     Family History  Problem Relation Age of Onset   Breast cancer Sister 3    Social History   Socioeconomic History   Marital status: Married    Spouse name: Not on file   Number of children: Not on file   Years of education: Not on file   Highest education level: Not on file  Occupational History   Not on file  Tobacco Use   Smoking status: Never   Smokeless tobacco: Never  Vaping Use   Vaping status: Never Used  Substance and Sexual Activity   Alcohol use: No   Drug use: No   Sexual activity: Not on file  Other Topics Concern   Not on file  Social History Narrative   Not on file   Social Drivers of Health   Financial Resource Strain: Not on file  Food Insecurity: Not on file   Transportation Needs: Not on file  Physical Activity: Not on file  Stress: Not on file  Social Connections: Not on file  Intimate Partner Violence: Not on file    Review of Systems  Constitutional: Negative.  Negative for chills, fever and malaise/fatigue.  HENT: Negative.  Negative for congestion and sore throat.   Eyes: Negative.  Negative for blurred vision and pain.  Respiratory: Negative.  Negative for cough and shortness of breath.   Cardiovascular: Negative.  Negative for chest pain, palpitations and leg swelling.  Gastrointestinal: Negative.  Negative for abdominal pain, blood in stool, constipation, diarrhea, heartburn, melena, nausea and vomiting.  Genitourinary: Negative.  Negative for dysuria, flank pain, frequency and urgency.  Musculoskeletal: Negative.  Negative for joint pain and myalgias.  Skin: Negative.   Neurological: Negative.  Negative for dizziness, tingling, sensory change, weakness and headaches.  Endo/Heme/Allergies: Negative.   Psychiatric/Behavioral: Negative.  Negative for depression and suicidal ideas. The patient is not nervous/anxious.         Objective   BP (!) 160/82   Pulse 69   Ht 5' (1.524 m)   Wt 176 lb (79.8 kg)   SpO2 98%   BMI 34.37 kg/m   Physical Exam Vitals and nursing note reviewed.  Constitutional:      Appearance: Normal appearance.  HENT:     Head: Normocephalic and atraumatic.     Nose: Nose normal.     Mouth/Throat:     Mouth: Mucous membranes are moist.     Pharynx: Oropharynx is clear.  Eyes:     Conjunctiva/sclera: Conjunctivae normal.     Pupils: Pupils are equal, round, and reactive to light.  Cardiovascular:     Rate and Rhythm: Normal rate. Rhythm irregular.     Pulses: Normal pulses.     Heart sounds: Normal heart sounds. No murmur heard. Pulmonary:     Effort: Pulmonary effort is normal.     Breath sounds: Normal breath sounds. No wheezing.  Abdominal:     General: Bowel sounds are normal.      Palpations: Abdomen is soft.     Tenderness: There is no abdominal tenderness. There is no right CVA tenderness or left CVA tenderness.  Musculoskeletal:        General: Normal range of motion.     Cervical back: Normal range of motion.     Right lower leg: No edema.     Left lower leg: No edema.  Skin:    General: Skin is warm and dry.  Neurological:     General: No focal deficit present.     Mental Status: She is alert and oriented to person, place, and time.  Psychiatric:        Mood and Affect: Mood normal.        Behavior: Behavior normal.        Assessment & Plan:  Medications adjusted, stopped metformin and Mevacor. Started Crestor , hydralazine  for blood pressure control. Check labs today. Follow-up with nephrologist as scheduled.  Patient reports she was not able to tolerate Farxiga in the past. Problem List Items Addressed This Visit     Stage 3b chronic kidney disease (HCC)   Diabetes (HCC)   Relevant Medications   OZEMPIC, 0.25 OR 0.5 MG/DOSE, 2 MG/3ML SOPN   rosuvastatin  (CRESTOR ) 20 MG tablet   Other Relevant Orders   POCT CBG (Fasting - Glucose) (Completed)   Hemoglobin A1c   Hypertension associated with diabetes (HCC) - Primary   Relevant Medications   OZEMPIC, 0.25 OR 0.5 MG/DOSE, 2 MG/3ML SOPN   hydrALAZINE  (APRESOLINE ) 25 MG tablet   rosuvastatin  (CRESTOR ) 20 MG tablet   Other Relevant Orders   CBC with Diff   CMP14+EGFR   OSA (obstructive sleep apnea)   Combined hyperlipidemia associated with type 2 diabetes mellitus (HCC)   Relevant Medications   OZEMPIC, 0.25 OR 0.5 MG/DOSE, 2 MG/3ML SOPN   hydrALAZINE  (APRESOLINE ) 25 MG tablet   rosuvastatin  (CRESTOR ) 20 MG tablet   Other Relevant Orders   Lipid Panel w/o Chol/HDL Ratio   Exophthalmos of left eye   Relevant Orders   TSH+T4F+T3Free   Ambulatory referral to Ophthalmology   Need for vaccination   Relevant Medications   COVID-19  mRNA vaccine, Pfizer, (COMIRNATY) syringe   Sinus arrhythmia    Relevant Medications   hydrALAZINE  (APRESOLINE ) 25 MG tablet   rosuvastatin  (CRESTOR ) 20 MG tablet   Other Relevant Orders   EKG 12-Lead    Return in about 3 weeks (around 11/29/2023).   Total time spent: 30 minutes  FERNAND FREDY RAMAN, MD  11/08/2023   This document may have been prepared by [facility] Voice Recognition software and as such may include unintentional dictation errors.

## 2023-11-08 NOTE — Progress Notes (Signed)
 Established Patient Office Visit  Subjective:  Patient ID: Cheryl Saunders, female    DOB: 03-03-1945  Age: 78 y.o. MRN: [id number]  Chief Complaint  Patient presents with   Establish Care    NPE     Patient is seen today to establish care as PCP; sees Cardiology in this office. Patient has hx of DM, HTN, hyperlipidemia, OSA, CKD 3b, and obestiy. Patient reports she is doing well but feels a little dizzy. Her random blood glucose when she arrived was 64. Encouraged patient to eat hard candy to bring up her blood sugar. Her last HbgA1c was 7% Farxiga could be a probable medication to add on depending on labs that will be collected today.  Patient's blood pressure is also elevated today. She reports taking all her medications as prescribed. Will start hydralazine  25 mg twice daily to help with blood pressure control. Her nephrologist specialist had her categorized as CKD 3a but her GFR has continued to decrease. Last GFR was 35 indicating CKD 3b. Patient reports upcoming appointment on 9/18 with Nephrologist. Encouraged patient to stop taking Metformin due to current GFR. Irregular heart rate auscultated. Will perform EKG. Asked patient about her OSA; she denies using a machine for OSA in over 25 years. Denies having any difficulty with sleep.  EKG showed sinus arrhythmia.   Patient reports having long term muscle aches and pains and thinks its related to her hyperlipidemia medication. She is currently on Lovastatin 40 mg and wonders if that is causing her muscle pains. Will switch patient to Crestor  20 mg once daily  Patient states she has upcoming eye exam with optometrist 10/25; Reports having cataracts removed a little over 1 year ago. encouraged patient to get DM/HTN eye exam from ophthalmologist. Patient also has evident left exophthalmos. Denies hx of thyroid disease. Referral sent to Ophthalmologist. Will add on TSH to routine lab draw to rule out exophthalmos due to thyroid  disease.  PHQ-9 score 1, GAD-7 score 0; no FU needed at this time. Prescription sent for covid vaccine at patients request.     No other concerns at this time.   Past Medical History:  Diagnosis Date   Chronic kidney disease (CKD), active medical management without dialysis, stage 3 (moderate) (HCC)    GERD (gastroesophageal reflux disease)    Heart murmur    followed by PCP   Hyperlipidemia associated with type 2 diabetes mellitus (HCC)    Hypertension    Sleep apnea    supposed to use CPAP. Does not.   Type 2 diabetes mellitus with hyperglycemia [facility])     Past Surgical History:  Procedure Laterality Date   ABDOMINAL HYSTERECTOMY     BREAST BIOPSY Right 11/22/2016    coil clip - PASH   CATARACT EXTRACTION W/ INTRAOCULAR LENS  IMPLANT, BILATERAL  2022   CHOLECYSTECTOMY     COLONOSCOPY WITH PROPOFOL  N/A 10/17/2015   Procedure: COLONOSCOPY WITH PROPOFOL ;  Surgeon: Rogelia Copping, MD;  Location: [facility];  Service: Endoscopy;  Laterality: N/A;  Diabetic - oral meds Sleep apnea   COLONOSCOPY WITH PROPOFOL  N/A 10/03/2020   Procedure: COLONOSCOPY WITH PROPOFOL ;  Surgeon: Copping Rogelia, MD;  Location: [facility];  Service: Endoscopy;  Laterality: N/A;  Diabetic   HERNIA REPAIR     POLYPECTOMY  10/17/2015   Procedure: POLYPECTOMY;  Surgeon: Rogelia Copping, MD;  Location: [facility];  Service: Endoscopy;;  Ascending colon polyps x 2   POLYPECTOMY  10/03/2020   Procedure: POLYPECTOMY;  Surgeon: Jinny Carmine, MD;  Location: [facility];  Service: Endoscopy;;   REPLACEMENT TOTAL KNEE Bilateral     Social History   Socioeconomic History   Marital status: Married    Spouse name: Not on file   Number of children: Not on file   Years of education: Not on file   Highest education level: Not on file  Occupational History   Not on file  Tobacco Use   Smoking status: Never   Smokeless tobacco: Never  Vaping Use   Vaping status: Never Used  Substance and  Sexual Activity   Alcohol use: No   Drug use: No   Sexual activity: Not on file  Other Topics Concern   Not on file  Social History Narrative   Not on file   Social Drivers of Health   Financial Resource Strain: Not on file  Food Insecurity: Not on file  Transportation Needs: Not on file  Physical Activity: Not on file  Stress: Not on file  Social Connections: Not on file  Intimate Partner Violence: Not on file    Family History  Problem Relation Age of Onset   Breast cancer Sister 35    No Known Allergies  Outpatient Medications Prior to Visit  Medication Sig   acetaminophen  (TYLENOL ) 500 MG tablet Take 1 tablet (500 mg total) by mouth every 6 (six) hours as needed.   amLODipine (NORVASC) 10 MG tablet Take 10 mg by mouth daily.   aspirin 81 MG tablet Take 81 mg by mouth daily.   benazepril (LOTENSIN) 40 MG tablet Take 40 mg by mouth daily.   chlorthalidone (HYGROTON) 25 MG tablet Take 25 mg by mouth daily.   glipiZIDE (GLUCOTROL XL) 10 MG 24 hr tablet Take 10 mg by mouth 2 (two) times daily. (Patient taking differently: Take 10 mg by mouth daily with breakfast.)   metFORMIN (GLUCOPHAGE) 1000 MG tablet Take 1,000 mg by mouth 2 (two) times daily with a meal. (Patient taking differently: Take 1,000 mg by mouth daily with breakfast.)   metoprolol succinate (TOPROL-XL) 50 MG 24 hr tablet Take 50 mg by mouth daily. Take with or immediately following a meal.   omeprazole (PRILOSEC) 20 MG capsule Take 20 mg by mouth daily.   OZEMPIC, 0.25 OR 0.5 MG/DOSE, 2 MG/3ML SOPN SMARTSIG:0.25 Milligram(s) SUB-Q Once a Week   TRUE METRIX BLOOD GLUCOSE TEST test strip SMARTSIG:Via Meter   TRUEplus Lancets 33G MISC SMARTSIG:Lancet Topical   [DISCONTINUED] lovastatin (MEVACOR) 40 MG tablet Take 40 mg by mouth at bedtime.   [DISCONTINUED] Semaglutide (OZEMPIC, 1 MG/DOSE, [place]) Inject 2.5 mg into the skin once a week. (Patient taking differently: Inject 2 mg into the skin once a week.)   Multiple  Vitamin (MULTIVITAMIN) tablet Take 1 tablet by mouth daily. (Patient not taking: Reported on 11/08/2023)   No facility-administered medications prior to visit.    Review of Systems  Constitutional: Negative.  Negative for chills, fever and malaise/fatigue.  HENT: Negative.  Negative for congestion and sore throat.   Eyes: Negative.  Negative for blurred vision and pain.  Respiratory: Negative.  Negative for cough and shortness of breath.   Cardiovascular: Negative.  Negative for chest pain, palpitations and leg swelling.  Gastrointestinal: Negative.  Negative for abdominal pain, blood in stool, constipation, diarrhea, heartburn, melena, nausea and vomiting.  Genitourinary: Negative.  Negative for dysuria, flank pain, frequency and urgency.  Musculoskeletal:  Positive for myalgias. Negative for joint pain.  Skin: Negative.   Neurological: Negative.  Negative for dizziness, tingling, sensory change, weakness and headaches.  Endo/Heme/Allergies: Negative.   Psychiatric/Behavioral: Negative.  Negative for depression and suicidal ideas. The patient is not nervous/anxious.        Objective:   BP (!) 160/82   Pulse 69   Ht 5' (1.524 m)   Wt 176 lb (79.8 kg)   SpO2 98%   BMI 34.37 kg/m   Vitals:   11/08/23 1307  BP: (!) 160/82  Pulse: 69  Height: 5' (1.524 m)  Weight: 176 lb (79.8 kg)  SpO2: 98%  BMI (Calculated): 34.37    Physical Exam Vitals and nursing note reviewed.  Constitutional:      Appearance: Normal appearance.  HENT:     Head: Normocephalic and atraumatic.     Nose: Nose normal.     Mouth/Throat:     Mouth: Mucous membranes are moist.     Pharynx: Oropharynx is clear.  Eyes:     Conjunctiva/sclera: Conjunctivae normal.     Pupils: Pupils are equal, round, and reactive to light.     Comments: Left exophthalmos  Cardiovascular:     Rate and Rhythm: Normal rate. Rhythm irregular.     Pulses: Normal pulses.     Heart sounds: Normal heart sounds. No murmur  heard. Pulmonary:     Effort: Pulmonary effort is normal.     Breath sounds: Normal breath sounds. No wheezing.  Abdominal:     General: Bowel sounds are normal.     Palpations: Abdomen is soft.     Tenderness: There is no abdominal tenderness. There is no right CVA tenderness or left CVA tenderness.  Musculoskeletal:        General: Normal range of motion.     Cervical back: Normal range of motion.     Right lower leg: No edema.     Left lower leg: No edema.  Skin:    General: Skin is warm and dry.  Neurological:     General: No focal deficit present.     Mental Status: She is alert and oriented to person, place, and time.  Psychiatric:        Mood and Affect: Mood normal.        Behavior: Behavior normal.      Results for orders placed or performed in visit on 11/08/23  POCT CBG (Fasting - Glucose)  Result Value Ref Range   Glucose Fasting, POC 64 (A) 70 - 99 mg/dL    Recent Results (from the past 2160 hours)  POCT CBG (Fasting - Glucose)     Status: Abnormal   Collection Time: 11/08/23  1:16 PM  Result Value Ref Range   Glucose Fasting, POC 64 (A) 70 - 99 mg/dL      Assessment & Plan:  Stop taking Metformin. Stop taking lovastatin. Start taking Crestor  20 mg once daily. Start taking Hydralazine  25 mg twice daily. Continue checking BP and CBG at home. Will check routine labs today and follow up patient with results. Will complete EKG for arrhythmia. Will send referral to Ophthalmologist. Covid vaccine ordered. Problem List Items Addressed This Visit     Diabetes (HCC) - Primary   Relevant Medications   OZEMPIC, 0.25 OR 0.5 MG/DOSE, 2 MG/3ML SOPN   rosuvastatin  (CRESTOR ) 20 MG tablet   Other Relevant Orders   POCT CBG (Fasting - Glucose) (Completed)   Hemoglobin A1c   Hypertension associated with diabetes (HCC)   Relevant Medications   OZEMPIC, 0.25 OR 0.5 MG/DOSE, 2 MG/3ML SOPN  hydrALAZINE  (APRESOLINE ) 25 MG tablet   rosuvastatin  (CRESTOR ) 20 MG tablet    Other Relevant Orders   CBC with Diff   CMP14+EGFR   OSA (obstructive sleep apnea)   Combined hyperlipidemia associated with type 2 diabetes mellitus (HCC)   Relevant Medications   OZEMPIC, 0.25 OR 0.5 MG/DOSE, 2 MG/3ML SOPN   hydrALAZINE  (APRESOLINE ) 25 MG tablet   rosuvastatin  (CRESTOR ) 20 MG tablet   Other Relevant Orders   Lipid Panel w/o Chol/HDL Ratio   Exophthalmos of left eye   Relevant Orders   TSH+T4F+T3Free   Ambulatory referral to Ophthalmology   Need for vaccination   Relevant Medications   COVID-19 mRNA vaccine, Pfizer, (COMIRNATY) syringe   Sinus arrhythmia   Relevant Medications   hydrALAZINE  (APRESOLINE ) 25 MG tablet   rosuvastatin  (CRESTOR ) 20 MG tablet   Other Relevant Orders   EKG 12-Lead    Return in about 3 weeks (around 11/29/2023).   Total time spent: 30 minutes  FERNAND FREDY RAMAN, MD  11/08/2023   This document may have been prepared by [facility] Voice Recognition software and as such may include unintentional dictation errors.

## 2023-11-09 LAB — CMP14+EGFR
ALT: 9 IU/L (ref 0–32)
AST: 13 IU/L (ref 0–40)
Albumin: 4.3 g/dL (ref 3.8–4.8)
Alkaline Phosphatase: 111 IU/L (ref 44–121)
BUN/Creatinine Ratio: 15 (ref 12–28)
BUN: 23 mg/dL (ref 8–27)
Bilirubin Total: 0.2 mg/dL (ref 0.0–1.2)
CO2: 24 mmol/L (ref 20–29)
Calcium: 9 mg/dL (ref 8.7–10.3)
Chloride: 103 mmol/L (ref 96–106)
Creatinine, Ser: 1.52 mg/dL — ABNORMAL HIGH (ref 0.57–1.00)
Globulin, Total: 2.3 g/dL (ref 1.5–4.5)
Glucose: 55 mg/dL — ABNORMAL LOW (ref 70–99)
Potassium: 3.9 mmol/L (ref 3.5–5.2)
Sodium: 141 mmol/L (ref 134–144)
Total Protein: 6.6 g/dL (ref 6.0–8.5)
eGFR: 35 mL/min/1.73 — ABNORMAL LOW (ref >59)

## 2023-11-09 LAB — CBC WITH DIFFERENTIAL/PLATELET
Basophils Absolute: 0.1 x10E3/uL (ref 0.0–0.2)
Basos: 1 %
EOS (ABSOLUTE): 0.2 x10E3/uL (ref 0.0–0.4)
Eos: 2 %
Hematocrit: 35.3 % (ref 34.0–46.6)
Hemoglobin: 11 g/dL — ABNORMAL LOW (ref 11.1–15.9)
Immature Grans (Abs): 0 x10E3/uL (ref 0.0–0.1)
Immature Granulocytes: 0 %
Lymphocytes Absolute: 1.8 x10E3/uL (ref 0.7–3.1)
Lymphs: 21 %
MCH: 28.3 pg (ref 26.6–33.0)
MCHC: 31.2 g/dL — ABNORMAL LOW (ref 31.5–35.7)
MCV: 91 fL (ref 79–97)
Monocytes Absolute: 0.8 x10E3/uL (ref 0.1–0.9)
Monocytes: 9 %
Neutrophils Absolute: 5.7 x10E3/uL (ref 1.4–7.0)
Neutrophils: 67 %
Platelets: 327 x10E3/uL (ref 150–450)
RBC: 3.89 x10E6/uL (ref 3.77–5.28)
RDW: 13.2 % (ref 11.7–15.4)
WBC: 8.5 x10E3/uL (ref 3.4–10.8)

## 2023-11-09 LAB — LIPID PANEL W/O CHOL/HDL RATIO
Cholesterol, Total: 161 mg/dL (ref 100–199)
HDL: 42 mg/dL (ref >39)
LDL Chol Calc (NIH): 84 mg/dL (ref 0–99)
Triglycerides: 205 mg/dL — ABNORMAL HIGH (ref 0–149)
VLDL Cholesterol Cal: 35 mg/dL (ref 5–40)

## 2023-11-09 LAB — HEMOGLOBIN A1C
Est. average glucose Bld gHb Est-mCnc: 148 mg/dL
Hgb A1c MFr Bld: 6.8 % — ABNORMAL HIGH (ref 4.8–5.6)

## 2023-11-09 LAB — TSH+T4F+T3FREE
Free T4: 1.39 ng/dL (ref 0.82–1.77)
T3, Free: 2.7 pg/mL (ref 2.0–4.4)
TSH: 0.904 u[IU]/mL (ref 0.450–4.500)

## 2023-11-11 ENCOUNTER — Ambulatory Visit: Payer: Self-pay | Admitting: Internal Medicine

## 2023-11-12 ENCOUNTER — Encounter: Payer: Self-pay | Admitting: Cardiovascular Disease

## 2023-11-12 ENCOUNTER — Ambulatory Visit (INDEPENDENT_AMBULATORY_CARE_PROVIDER_SITE_OTHER): Admitting: Cardiovascular Disease

## 2023-11-12 VITALS — BP 150/62 | HR 55 | Ht 60.0 in | Wt 175.2 lb

## 2023-11-12 DIAGNOSIS — I152 Hypertension secondary to endocrine disorders: Secondary | ICD-10-CM | POA: Diagnosis not present

## 2023-11-12 DIAGNOSIS — E1169 Type 2 diabetes mellitus with other specified complication: Secondary | ICD-10-CM | POA: Diagnosis not present

## 2023-11-12 DIAGNOSIS — E782 Mixed hyperlipidemia: Secondary | ICD-10-CM | POA: Diagnosis not present

## 2023-11-12 DIAGNOSIS — E1165 Type 2 diabetes mellitus with hyperglycemia: Secondary | ICD-10-CM

## 2023-11-12 DIAGNOSIS — N1832 Chronic kidney disease, stage 3b: Secondary | ICD-10-CM | POA: Diagnosis not present

## 2023-11-12 DIAGNOSIS — E1159 Type 2 diabetes mellitus with other circulatory complications: Secondary | ICD-10-CM | POA: Diagnosis not present

## 2023-11-12 DIAGNOSIS — I340 Nonrheumatic mitral (valve) insufficiency: Secondary | ICD-10-CM | POA: Diagnosis not present

## 2023-11-12 DIAGNOSIS — R0602 Shortness of breath: Secondary | ICD-10-CM | POA: Diagnosis not present

## 2023-11-12 MED ORDER — ROSUVASTATIN CALCIUM 20 MG PO TABS
20.0000 mg | ORAL_TABLET | Freq: Every day | ORAL | 3 refills | Status: AC
Start: 2023-11-12 — End: 2024-11-11

## 2023-11-12 NOTE — Progress Notes (Signed)
 Cardiology Office Note   Date:  11/12/2023   ID:  Cheryl Saunders, DOB 11/19/1945, MRN [id number]  PCP:  Fernand Fredy RAMAN, MD  Cardiologist:  Denyse Fernand, MD      History of Present Illness: Cheryl Saunders is a 78 y.o. female who presents for  Chief Complaint  Patient presents with   Follow-up    3 month follow up    Has no symptoms.      Past Medical History:  Diagnosis Date   Chronic kidney disease (CKD), active medical management without dialysis, stage 3 (moderate) (HCC)    GERD (gastroesophageal reflux disease)    Heart murmur    followed by PCP   Hyperlipidemia associated with type 2 diabetes mellitus (HCC)    Hypertension    Sleep apnea    supposed to use CPAP. Does not.   Type 2 diabetes mellitus with hyperglycemia [facility])      Past Surgical History:  Procedure Laterality Date   ABDOMINAL HYSTERECTOMY     BREAST BIOPSY Right 11/22/2016    coil clip - PASH   CATARACT EXTRACTION W/ INTRAOCULAR LENS  IMPLANT, BILATERAL  2022   CHOLECYSTECTOMY     COLONOSCOPY WITH PROPOFOL  N/A 10/17/2015   Procedure: COLONOSCOPY WITH PROPOFOL ;  Surgeon: Rogelia Copping, MD;  Location: [facility];  Service: Endoscopy;  Laterality: N/A;  Diabetic - oral meds Sleep apnea   COLONOSCOPY WITH PROPOFOL  N/A 10/03/2020   Procedure: COLONOSCOPY WITH PROPOFOL ;  Surgeon: Copping Rogelia, MD;  Location: [facility];  Service: Endoscopy;  Laterality: N/A;  Diabetic   HERNIA REPAIR     POLYPECTOMY  10/17/2015   Procedure: POLYPECTOMY;  Surgeon: Rogelia Copping, MD;  Location: [facility];  Service: Endoscopy;;  Ascending colon polyps x 2   POLYPECTOMY  10/03/2020   Procedure: POLYPECTOMY;  Surgeon: Copping Rogelia, MD;  Location: [facility];  Service: Endoscopy;;   REPLACEMENT TOTAL KNEE Bilateral      Current Outpatient Medications  Medication Sig Dispense Refill   acetaminophen  (TYLENOL ) 500 MG tablet Take 1 tablet (500 mg total) by mouth every 6 (six) hours as  needed. 30 tablet 0   amLODipine (NORVASC) 10 MG tablet Take 10 mg by mouth daily.     aspirin 81 MG tablet Take 81 mg by mouth daily.     benazepril (LOTENSIN) 40 MG tablet Take 40 mg by mouth daily.     chlorthalidone (HYGROTON) 25 MG tablet Take 25 mg by mouth daily.     glipiZIDE (GLUCOTROL XL) 10 MG 24 hr tablet Take 10 mg by mouth 2 (two) times daily. (Patient taking differently: Take 10 mg by mouth daily with breakfast.)     hydrALAZINE  (APRESOLINE ) 25 MG tablet Take 1 tablet (25 mg total) by mouth 2 (two) times daily. 60 tablet 3   metoprolol succinate (TOPROL-XL) 50 MG 24 hr tablet Take 50 mg by mouth daily. Take with or immediately following a meal.     Multiple Vitamin (MULTIVITAMIN) tablet Take 1 tablet by mouth daily.     omeprazole (PRILOSEC) 20 MG capsule Take 20 mg by mouth daily.     OZEMPIC, 0.25 OR 0.5 MG/DOSE, 2 MG/3ML SOPN SMARTSIG:0.25 Milligram(s) SUB-Q Once a Week     TRUE METRIX BLOOD GLUCOSE TEST test strip SMARTSIG:Via Meter     TRUEplus Lancets 33G MISC SMARTSIG:Lancet Topical     rosuvastatin  (CRESTOR ) 20 MG tablet Take 1 tablet (20 mg total) by mouth daily. 90 tablet 3  No current facility-administered medications for this visit.    Allergies:   Patient has no known allergies.    Social History:   reports that she has never smoked. She has never used smokeless tobacco. She reports that she does not drink alcohol and does not use drugs.   Family History:  family history includes Breast cancer (age of onset: 73) in her sister.    ROS:     Review of Systems  Constitutional: Negative.   HENT: Negative.    Eyes: Negative.   Respiratory: Negative.    Gastrointestinal: Negative.   Genitourinary: Negative.   Musculoskeletal: Negative.   Skin: Negative.   Neurological: Negative.   Endo/Heme/Allergies: Negative.   Psychiatric/Behavioral: Negative.    All other systems reviewed and are negative.     All other systems are reviewed and negative.     PHYSICAL EXAM: VS:  BP (!) 150/62   Pulse (!) 55   Ht 5' (1.524 m)   Wt 175 lb 3.2 oz (79.5 kg)   SpO2 97%   BMI 34.22 kg/m  , BMI Body mass index is 34.22 kg/m. Last weight:  Wt Readings from Last 3 Encounters:  11/12/23 175 lb 3.2 oz (79.5 kg)  11/08/23 176 lb (79.8 kg)  08/06/23 172 lb (78 kg)     Physical Exam Constitutional:      Appearance: Normal appearance.  Cardiovascular:     Rate and Rhythm: Normal rate and regular rhythm.     Heart sounds: Normal heart sounds.  Pulmonary:     Effort: Pulmonary effort is normal.     Breath sounds: Normal breath sounds.  Musculoskeletal:     Right lower leg: No edema.     Left lower leg: No edema.  Neurological:     Mental Status: She is alert.       EKG:   Recent Labs: 11/08/2023: ALT 9; BUN 23; Creatinine, Ser 1.52; Hemoglobin 11.0; Platelets 327; Potassium 3.9; Sodium 141; TSH 0.904    Lipid Panel    Component Value Date/Time   CHOL 161 11/08/2023 1422   TRIG 205 (H) 11/08/2023 1422   HDL 42 11/08/2023 1422   LDLCALC 84 11/08/2023 1422      Other studies Reviewed: Additional studies/ records that were reviewed today include:  Review of the above records demonstrates:       No data to display            ASSESSMENT AND PLAN:    ICD-10-CM   1. Type 2 diabetes mellitus with hyperglycemia, without long-term current use of insulin (HCC)  E11.65 PCV ECHOCARDIOGRAM COMPLETE    rosuvastatin  (CRESTOR ) 20 MG tablet    MYOCARDIAL PERFUSION IMAGING    2. Hypertension associated with diabetes (HCC)  E11.59 PCV ECHOCARDIOGRAM COMPLETE   I15.2 rosuvastatin  (CRESTOR ) 20 MG tablet    MYOCARDIAL PERFUSION IMAGING   systolic high, but diastolic low, so lno change in medications.    3. Mixed hyperlipidemia  E78.2 PCV ECHOCARDIOGRAM COMPLETE    rosuvastatin  (CRESTOR ) 20 MG tablet    MYOCARDIAL PERFUSION IMAGING    4. Nonrheumatic mitral valve regurgitation  I34.0 PCV ECHOCARDIOGRAM COMPLETE    rosuvastatin   (CRESTOR ) 20 MG tablet    MYOCARDIAL PERFUSION IMAGING    5. Stage 3b chronic kidney disease (HCC)  N18.32 PCV ECHOCARDIOGRAM COMPLETE    rosuvastatin  (CRESTOR ) 20 MG tablet    MYOCARDIAL PERFUSION IMAGING    6. SOB (shortness of breath)  R06.02 PCV ECHOCARDIOGRAM COMPLETE  rosuvastatin  (CRESTOR ) 20 MG tablet    MYOCARDIAL PERFUSION IMAGING   Feels tired and SOB, and has risk factors for CAD, advis echo, stress test.    7. Combined hyperlipidemia associated with type 2 diabetes mellitus (HCC)  E11.69 rosuvastatin  (CRESTOR ) 20 MG tablet   E78.2 MYOCARDIAL PERFUSION IMAGING       Problem List Items Addressed This Visit       Cardiovascular and Mediastinum   Hypertension associated with diabetes (HCC)   Relevant Medications   rosuvastatin  (CRESTOR ) 20 MG tablet   Other Relevant Orders   PCV ECHOCARDIOGRAM COMPLETE   MYOCARDIAL PERFUSION IMAGING   Nonrheumatic mitral valve regurgitation   Relevant Medications   rosuvastatin  (CRESTOR ) 20 MG tablet   Other Relevant Orders   PCV ECHOCARDIOGRAM COMPLETE   MYOCARDIAL PERFUSION IMAGING     Endocrine   Diabetes (HCC) - Primary   Relevant Medications   rosuvastatin  (CRESTOR ) 20 MG tablet   Other Relevant Orders   PCV ECHOCARDIOGRAM COMPLETE   MYOCARDIAL PERFUSION IMAGING   Combined hyperlipidemia associated with type 2 diabetes mellitus (HCC)   Relevant Medications   rosuvastatin  (CRESTOR ) 20 MG tablet   Other Relevant Orders   MYOCARDIAL PERFUSION IMAGING     Genitourinary   Stage 3b chronic kidney disease (HCC)   Relevant Medications   rosuvastatin  (CRESTOR ) 20 MG tablet   Other Relevant Orders   PCV ECHOCARDIOGRAM COMPLETE   MYOCARDIAL PERFUSION IMAGING     Other   Mixed hyperlipidemia   Relevant Medications   rosuvastatin  (CRESTOR ) 20 MG tablet   Other Relevant Orders   PCV ECHOCARDIOGRAM COMPLETE   MYOCARDIAL PERFUSION IMAGING   Other Visit Diagnoses       SOB (shortness of breath)       Feels tired and  SOB, and has risk factors for CAD, advis echo, stress test.   Relevant Medications   rosuvastatin  (CRESTOR ) 20 MG tablet   Other Relevant Orders   PCV ECHOCARDIOGRAM COMPLETE   MYOCARDIAL PERFUSION IMAGING          Disposition:   Return in about 4 weeks (around 12/10/2023) for eecho, stress test and f/u.    Total time spent: 35 minutes  Signed,  Denyse Bathe, MD  11/12/2023 10:05 AM    [facility]

## 2023-11-14 DIAGNOSIS — E1122 Type 2 diabetes mellitus with diabetic chronic kidney disease: Secondary | ICD-10-CM | POA: Diagnosis not present

## 2023-11-14 DIAGNOSIS — I10 Essential (primary) hypertension: Secondary | ICD-10-CM | POA: Diagnosis not present

## 2023-11-14 DIAGNOSIS — R801 Persistent proteinuria, unspecified: Secondary | ICD-10-CM | POA: Diagnosis not present

## 2023-11-14 DIAGNOSIS — N1831 Chronic kidney disease, stage 3a: Secondary | ICD-10-CM | POA: Diagnosis not present

## 2023-11-14 DIAGNOSIS — E785 Hyperlipidemia, unspecified: Secondary | ICD-10-CM | POA: Diagnosis not present

## 2023-11-14 LAB — MICROALBUMIN / CREATININE URINE RATIO: Microalb Creat Ratio: 598.5

## 2023-11-15 ENCOUNTER — Other Ambulatory Visit: Payer: Self-pay

## 2023-11-15 ENCOUNTER — Other Ambulatory Visit: Admission: RE | Admit: 2023-11-15 | Discharge: 2023-11-15 | Disposition: A | Discharge status: Home / Self Care

## 2023-11-15 DIAGNOSIS — Z961 Presence of intraocular lens: Secondary | ICD-10-CM | POA: Diagnosis not present

## 2023-11-15 DIAGNOSIS — H02536 Eyelid retraction left eye, unspecified eyelid: Secondary | ICD-10-CM

## 2023-11-15 DIAGNOSIS — E119 Type 2 diabetes mellitus without complications: Secondary | ICD-10-CM | POA: Diagnosis not present

## 2023-11-15 DIAGNOSIS — H35411 Lattice degeneration of retina, right eye: Secondary | ICD-10-CM | POA: Diagnosis not present

## 2023-11-15 LAB — TSH: TSH: 0.91 u[IU]/mL (ref 0.350–4.500)

## 2023-11-15 LAB — T4, FREE: Free T4: 1.05 ng/dL (ref 0.61–1.12)

## 2023-11-16 LAB — THYROID STIMULATING IMMUNOGLOBULIN: Thyroid Stimulating Immunoglob: <0.1 IU/L (ref 0.00–0.55)

## 2023-11-17 LAB — T3, FREE: T3, Free: 2.9 pg/mL (ref 2.0–4.4)

## 2023-11-19 ENCOUNTER — Encounter: Payer: Self-pay | Admitting: Internal Medicine

## 2023-11-22 ENCOUNTER — Ambulatory Visit

## 2023-11-22 DIAGNOSIS — I361 Nonrheumatic tricuspid (valve) insufficiency: Secondary | ICD-10-CM

## 2023-11-22 DIAGNOSIS — E782 Mixed hyperlipidemia: Secondary | ICD-10-CM

## 2023-11-22 DIAGNOSIS — E1165 Type 2 diabetes mellitus with hyperglycemia: Secondary | ICD-10-CM

## 2023-11-22 DIAGNOSIS — I340 Nonrheumatic mitral (valve) insufficiency: Secondary | ICD-10-CM

## 2023-11-22 DIAGNOSIS — I371 Nonrheumatic pulmonary valve insufficiency: Secondary | ICD-10-CM

## 2023-11-22 DIAGNOSIS — E1159 Type 2 diabetes mellitus with other circulatory complications: Secondary | ICD-10-CM

## 2023-11-22 DIAGNOSIS — N1832 Chronic kidney disease, stage 3b: Secondary | ICD-10-CM

## 2023-11-22 DIAGNOSIS — R0602 Shortness of breath: Secondary | ICD-10-CM

## 2023-11-25 ENCOUNTER — Ambulatory Visit
Admission: RE | Admit: 2023-11-25 | Discharge: 2023-11-25 | Disposition: A | Discharge status: Home / Self Care | Source: Ambulatory Visit

## 2023-11-25 DIAGNOSIS — E0500 Thyrotoxicosis with diffuse goiter without thyrotoxic crisis or storm: Secondary | ICD-10-CM | POA: Insufficient documentation

## 2023-11-25 DIAGNOSIS — H02536 Eyelid retraction left eye, unspecified eyelid: Secondary | ICD-10-CM | POA: Diagnosis not present

## 2023-11-25 DIAGNOSIS — H0520 Unspecified exophthalmos: Secondary | ICD-10-CM | POA: Insufficient documentation

## 2023-11-25 DIAGNOSIS — H0589 Other disorders of orbit: Secondary | ICD-10-CM | POA: Diagnosis not present

## 2023-11-25 DIAGNOSIS — G319 Degenerative disease of nervous system, unspecified: Secondary | ICD-10-CM | POA: Diagnosis not present

## 2023-11-29 ENCOUNTER — Ambulatory Visit: Admitting: Internal Medicine

## 2023-12-02 ENCOUNTER — Ambulatory Visit: Admitting: Internal Medicine

## 2023-12-02 ENCOUNTER — Ambulatory Visit: Payer: Self-pay | Admitting: Internal Medicine

## 2023-12-02 ENCOUNTER — Encounter: Payer: Self-pay | Admitting: Internal Medicine

## 2023-12-02 VITALS — BP 152/72 | HR 70 | Ht 60.0 in | Wt 179.0 lb

## 2023-12-02 DIAGNOSIS — H0520 Unspecified exophthalmos: Secondary | ICD-10-CM | POA: Diagnosis not present

## 2023-12-02 DIAGNOSIS — R801 Persistent proteinuria, unspecified: Secondary | ICD-10-CM | POA: Diagnosis not present

## 2023-12-02 DIAGNOSIS — N1832 Chronic kidney disease, stage 3b: Secondary | ICD-10-CM

## 2023-12-02 DIAGNOSIS — E1165 Type 2 diabetes mellitus with hyperglycemia: Secondary | ICD-10-CM | POA: Diagnosis not present

## 2023-12-02 DIAGNOSIS — E1169 Type 2 diabetes mellitus with other specified complication: Secondary | ICD-10-CM

## 2023-12-02 DIAGNOSIS — E782 Mixed hyperlipidemia: Secondary | ICD-10-CM | POA: Diagnosis not present

## 2023-12-02 DIAGNOSIS — I152 Hypertension secondary to endocrine disorders: Secondary | ICD-10-CM | POA: Diagnosis not present

## 2023-12-02 DIAGNOSIS — Z23 Encounter for immunization: Secondary | ICD-10-CM | POA: Diagnosis not present

## 2023-12-02 DIAGNOSIS — E1159 Type 2 diabetes mellitus with other circulatory complications: Secondary | ICD-10-CM | POA: Diagnosis not present

## 2023-12-02 LAB — POCT CBG (FASTING - GLUCOSE)-MANUAL ENTRY: Glucose Fasting, POC: 280 mg/dL — AB (ref 70–99)

## 2023-12-02 MED ORDER — HYDRALAZINE HCL 50 MG PO TABS: 50.0000 mg | ORAL_TABLET | Freq: Two times a day (BID) | ORAL | 3 refills | Status: DC

## 2023-12-02 MED ORDER — DAPAGLIFLOZIN PROPANEDIOL 5 MG PO TABS: 5.0000 mg | ORAL_TABLET | Freq: Every day | ORAL | 3 refills | Status: DC

## 2023-12-02 NOTE — Progress Notes (Signed)
 Established Patient Office Visit  Subjective:  Patient ID: Cheryl Saunders, female    DOB: 1945/08/23  Age: 78 y.o. MRN: [id number]  Chief Complaint  Patient presents with   Follow-up    3 week follow up    Patient is here today to follow up. She reports she has been seen by her Nephrologist and Ophthalmologist since her previous appointment. The Nephrologist suggested starting Farxiga and to discuss with PCP. Will send prescription to start today. She reports blood glucose at home running 120s when fasting. She has stopped the Metformin as previously recommended and is taking Glipizide and Ozempic 0.25 mg weekly injection. Will discuss increase in Ozempic dose at future appointment.  Patient saw Ophthalmologist and they sent an order for head CT. Patient has that completed and FU with Ophthalmologist 12/03/23.  Patient reports she has started taking Crestor  20 mg and stopped lovastatin 40 mg. She still endorses pain in her legs at night and states her legs feel swollen. She does have 1+ BLE edema noted on physical exam. She is taking amlodipine 10 mg daily and Chlorthalidone 25 mg daily. If swelling remains issue will reassess reducing amlodipine to 5 mg at future appointment. Encouraged patient to wear compression stockings daily and elevate her feet throughout the day to help reduce swelling.  She is taking her blood pressure medications as prescribed. Her BP is still elevated today. Will increase hydralazine  to 50 mg twice daily for improved BP control. Reinforced low sodium diet.  Patient would like a flu shot today. She over all is doing well and has no additional complaints.    No other concerns at this time.   Past Medical History:  Diagnosis Date   Chronic kidney disease (CKD), active medical management without dialysis, stage 3 (moderate) (HCC)    GERD (gastroesophageal reflux disease)    Heart murmur    followed by PCP   Hyperlipidemia associated with type 2 diabetes mellitus  (HCC)    Hypertension    Sleep apnea    supposed to use CPAP. Does not.   Type 2 diabetes mellitus with hyperglycemia [facility])     Past Surgical History:  Procedure Laterality Date   ABDOMINAL HYSTERECTOMY     BREAST BIOPSY Right 11/22/2016    coil clip - PASH   CATARACT EXTRACTION W/ INTRAOCULAR LENS  IMPLANT, BILATERAL  2022   CHOLECYSTECTOMY     COLONOSCOPY WITH PROPOFOL  N/A 10/17/2015   Procedure: COLONOSCOPY WITH PROPOFOL ;  Surgeon: Rogelia Copping, MD;  Location: [facility];  Service: Endoscopy;  Laterality: N/A;  Diabetic - oral meds Sleep apnea   COLONOSCOPY WITH PROPOFOL  N/A 10/03/2020   Procedure: COLONOSCOPY WITH PROPOFOL ;  Surgeon: Copping Rogelia, MD;  Location: [facility];  Service: Endoscopy;  Laterality: N/A;  Diabetic   HERNIA REPAIR     POLYPECTOMY  10/17/2015   Procedure: POLYPECTOMY;  Surgeon: Rogelia Copping, MD;  Location: [facility];  Service: Endoscopy;;  Ascending colon polyps x 2   POLYPECTOMY  10/03/2020   Procedure: POLYPECTOMY;  Surgeon: Copping Rogelia, MD;  Location: [facility];  Service: Endoscopy;;   REPLACEMENT TOTAL KNEE Bilateral     Social History   Socioeconomic History   Marital status: Married    Spouse name: Not on file   Number of children: Not on file   Years of education: Not on file   Highest education level: Not on file  Occupational History   Not on file  Tobacco Use  Smoking status: Never   Smokeless tobacco: Never  Vaping Use   Vaping status: Never Used  Substance and Sexual Activity   Alcohol use: No   Drug use: No   Sexual activity: Not on file  Other Topics Concern   Not on file  Social History Narrative   Not on file   Social Drivers of Health   Financial Resource Strain: Not on file  Food Insecurity: Not on file  Transportation Needs: Not on file  Physical Activity: Not on file  Stress: Not on file  Social Connections: Not on file  Intimate Partner Violence: Not on file    Family  History  Problem Relation Age of Onset   Breast cancer Sister 61    No Known Allergies  Outpatient Medications Prior to Visit  Medication Sig   acetaminophen  (TYLENOL ) 500 MG tablet Take 1 tablet (500 mg total) by mouth every 6 (six) hours as needed.   amLODipine (NORVASC) 10 MG tablet Take 10 mg by mouth daily.   aspirin 81 MG tablet Take 81 mg by mouth daily.   benazepril (LOTENSIN) 40 MG tablet Take 40 mg by mouth daily.   chlorthalidone (HYGROTON) 25 MG tablet Take 25 mg by mouth daily.   glipiZIDE (GLUCOTROL XL) 10 MG 24 hr tablet Take 10 mg by mouth 2 (two) times daily. (Patient taking differently: Take 10 mg by mouth daily with breakfast.)   metoprolol succinate (TOPROL-XL) 50 MG 24 hr tablet Take 50 mg by mouth daily. Take with or immediately following a meal.   omeprazole (PRILOSEC) 20 MG capsule Take 20 mg by mouth daily.   OZEMPIC, 0.25 OR 0.5 MG/DOSE, 2 MG/3ML SOPN SMARTSIG:0.25 Milligram(s) SUB-Q Once a Week   rosuvastatin  (CRESTOR ) 20 MG tablet Take 1 tablet (20 mg total) by mouth daily.   TRUE METRIX BLOOD GLUCOSE TEST test strip SMARTSIG:Via Meter   TRUEplus Lancets 33G MISC SMARTSIG:Lancet Topical   [DISCONTINUED] hydrALAZINE  (APRESOLINE ) 25 MG tablet Take 1 tablet (25 mg total) by mouth 2 (two) times daily.   [DISCONTINUED] Multiple Vitamin (MULTIVITAMIN) tablet Take 1 tablet by mouth daily. (Patient not taking: Reported on 12/02/2023)   No facility-administered medications prior to visit.    Review of Systems  Constitutional: Negative.  Negative for chills, fever and malaise/fatigue.  HENT: Negative.  Negative for congestion and sore throat.   Eyes: Negative.  Negative for blurred vision and pain.  Respiratory: Negative.  Negative for cough and shortness of breath.   Cardiovascular:  Positive for leg swelling (BLE). Negative for chest pain and palpitations.  Gastrointestinal: Negative.  Negative for abdominal pain, blood in stool, constipation, diarrhea, heartburn,  melena, nausea and vomiting.  Genitourinary: Negative.  Negative for dysuria, flank pain, frequency and urgency.  Musculoskeletal:  Positive for myalgias (BLE). Negative for joint pain.  Skin: Negative.   Neurological: Negative.  Negative for dizziness, tingling, sensory change, weakness and headaches.  Endo/Heme/Allergies: Negative.   Psychiatric/Behavioral: Negative.  Negative for depression and suicidal ideas. The patient is not nervous/anxious.        Objective:   BP (!) 152/72   Pulse 70   Ht 5' (1.524 m)   Wt 179 lb (81.2 kg)   SpO2 97%   BMI 34.96 kg/m   Vitals:   12/02/23 1108  BP: (!) 152/72  Pulse: 70  Height: 5' (1.524 m)  Weight: 179 lb (81.2 kg)  SpO2: 97%  BMI (Calculated): 34.96    Physical Exam Vitals and nursing note reviewed.  Constitutional:  Appearance: Normal appearance.  HENT:     Head: Normocephalic and atraumatic.     Nose: Nose normal.     Mouth/Throat:     Mouth: Mucous membranes are moist.     Pharynx: Oropharynx is clear.  Eyes:     Conjunctiva/sclera: Conjunctivae normal.     Pupils: Pupils are equal, round, and reactive to light.     Comments: Left eye exophthalmos  Cardiovascular:     Rate and Rhythm: Normal rate and regular rhythm.     Pulses: Normal pulses.     Heart sounds: Normal heart sounds. No murmur heard. Pulmonary:     Effort: Pulmonary effort is normal.     Breath sounds: Normal breath sounds. No wheezing.  Abdominal:     General: Bowel sounds are normal.     Palpations: Abdomen is soft.     Tenderness: There is no abdominal tenderness. There is no right CVA tenderness or left CVA tenderness.  Musculoskeletal:        General: Normal range of motion.     Cervical back: Normal range of motion.     Right lower leg: 1+ Pitting Edema present.     Left lower leg: 1+ Pitting Edema present.  Skin:    General: Skin is warm and dry.  Neurological:     General: No focal deficit present.     Mental Status: She is alert  and oriented to person, place, and time.  Psychiatric:        Mood and Affect: Mood normal.        Behavior: Behavior normal.      Results for orders placed or performed in visit on 12/02/23  Microalbumin / creatinine urine ratio  Result Value Ref Range   Microalb Creat Ratio 598.5   POCT CBG (Fasting - Glucose)  Result Value Ref Range   Glucose Fasting, POC 280 (A) 70 - 99 mg/dL    Recent Results (from the past 2160 hours)  POCT CBG (Fasting - Glucose)     Status: Abnormal   Collection Time: 11/08/23  1:16 PM  Result Value Ref Range   Glucose Fasting, POC 64 (A) 70 - 99 mg/dL  CBC with Diff     Status: Abnormal   Collection Time: 11/08/23  2:22 PM  Result Value Ref Range   WBC 8.5 3.4 - 10.8 x10E3/uL   RBC 3.89 3.77 - 5.28 x10E6/uL   Hemoglobin 11.0 (L) 11.1 - 15.9 g/dL   Hematocrit 64.6 65.9 - 46.6 %   MCV 91 79 - 97 fL   MCH 28.3 26.6 - 33.0 pg   MCHC 31.2 (L) 31.5 - 35.7 g/dL   RDW 86.7 88.2 - 84.5 %   Platelets 327 150 - 450 x10E3/uL   Neutrophils 67 Not Estab. %   Lymphs 21 Not Estab. %   Monocytes 9 Not Estab. %   Eos 2 Not Estab. %   Basos 1 Not Estab. %   Neutrophils Absolute 5.7 1.4 - 7.0 x10E3/uL   Lymphocytes Absolute 1.8 0.7 - 3.1 x10E3/uL   Monocytes Absolute 0.8 0.1 - 0.9 x10E3/uL   EOS (ABSOLUTE) 0.2 0.0 - 0.4 x10E3/uL   Basophils Absolute 0.1 0.0 - 0.2 x10E3/uL   Immature Granulocytes 0 Not Estab. %   Immature Grans (Abs) 0.0 0.0 - 0.1 x10E3/uL  CMP14+EGFR     Status: Abnormal   Collection Time: 11/08/23  2:22 PM  Result Value Ref Range   Glucose 55 (L) 70 - 99 mg/dL  BUN 23 8 - 27 mg/dL   Creatinine, Ser 8.47 (H) 0.57 - 1.00 mg/dL   eGFR 35 (L) >40 fO/fpw/8.26   BUN/Creatinine Ratio 15 12 - 28   Sodium 141 134 - 144 mmol/L   Potassium 3.9 3.5 - 5.2 mmol/L   Chloride 103 96 - 106 mmol/L   CO2 24 20 - 29 mmol/L   Calcium  9.0 8.7 - 10.3 mg/dL   Total Protein 6.6 6.0 - 8.5 g/dL   Albumin 4.3 3.8 - 4.8 g/dL   Globulin, Total 2.3 1.5 - 4.5  g/dL   Bilirubin Total 0.2 0.0 - 1.2 mg/dL   Alkaline Phosphatase 111 44 - 121 IU/L    Comment: **Effective November 11, 2023 Alkaline Phosphatase**   reference interval will be changing to:              Age                Female          Female           0 -  5 days         47 - 127       47 - 127           6 - 10 days         29 - 242       29 - 242          11 - 20 days        109 - 357      109 - 357          21 - 30 days         94 - 494       94 - 494           1 -  2 months      149 - 539      149 - 539           3 -  6 months      131 - 452      131 - 452           7 - 11 months      117 - 401      117 - 401   12 months -  6 years       158 - 369      158 - 369           7 - 12 years       150 - 409      150 - 409               13 years       156 - 435       78 - 227               14 years       114 - 375       64 - 161               15 years        88 - 279       56 - 134               16 years        74 - 207       51 - 121  17 years        63 - 161       47 - 113          18 - 20 years        51 - 125       42 - 106          21 - 50 years         47 - 123       41 - 116          51 - 80 years        49 - 135       51 - 125              >80 years        48 - 129       48 - 129    AST 13 0 - 40 IU/L   ALT 9 0 - 32 IU/L  Lipid Panel w/o Chol/HDL Ratio     Status: Abnormal   Collection Time: 11/08/23  2:22 PM  Result Value Ref Range   Cholesterol, Total 161 100 - 199 mg/dL   Triglycerides 794 (H) 0 - 149 mg/dL   HDL 42 >60 mg/dL   VLDL Cholesterol Cal 35 5 - 40 mg/dL   LDL Chol Calc (NIH) 84 0 - 99 mg/dL  Hemoglobin J8r     Status: Abnormal   Collection Time: 11/08/23  2:22 PM  Result Value Ref Range   Hgb A1c MFr Bld 6.8 (H) 4.8 - 5.6 %    Comment:          Prediabetes: 5.7 - 6.4          Diabetes: >6.4          Glycemic control for adults with diabetes: <7.0    Est. average glucose Bld gHb Est-mCnc 148 mg/dL  [id number]     Status: None    Collection Time: 11/08/23  2:22 PM  Result Value Ref Range   TSH 0.904 0.450 - 4.500 uIU/mL   T3, Free 2.7 2.0 - 4.4 pg/mL   Free T4 1.39 0.82 - 1.77 ng/dL  Microalbumin / creatinine urine ratio     Status: None   Collection Time: 11/14/23 12:00 AM  Result Value Ref Range   Microalb Creat Ratio 598.5     Comment: collected at [facility] Nephologist office  T4, free     Status: None   Collection Time: 11/15/23 12:02 PM  Result Value Ref Range   Free T4 1.05 0.61 - 1.12 ng/dL    Comment: (NOTE) Biotin ingestion may interfere with free T4 tests. If the results are inconsistent with the TSH level, previous test results, or the clinical presentation, then consider biotin interference. If needed, order repeat testing after stopping biotin. Performed at [facility], [address] [place], [place] [address]   TSH     Status: None   Collection Time: 11/15/23 12:02 PM  Result Value Ref Range   TSH 0.910 0.350 - 4.500 uIU/mL    Comment: Performed by a 3rd Generation assay with a functional sensitivity of <=0.01 uIU/mL. Performed at [facility], [address] [place], [place] [address]   T3, free     Status: None   Collection Time: 11/15/23 12:02 PM  Result Value Ref Range   T3, Free 2.9 2.0 - 4.4 pg/mL    Comment: (NOTE) Performed At: [facility] [address]  [address] [place], [place] [id number] Jennette Shorter MD Ey:[phone]   Thyroid  stimulating immunoglobulin     Status: None   Collection Time: 11/15/23 12:02 PM  Result Value Ref Range   Thyroid  Stimulating Immunoglob <0.10 0.00 - 0.55 IU/L    Comment: (NOTE) Performed At: [facility] [address] [place], [place] [id number] Jennette Shorter MD Ey:[id number]   POCT CBG (Fasting - Glucose)     Status: Abnormal   Collection Time: 12/02/23 11:14 AM  Result Value Ref Range   Glucose Fasting, POC 280 (A) 70 - 99 mg/dL      Assessment & Plan:  Increase hydralazine  to 50 mg twice daily. Will  start Farxiga 5 mg daily. Continue taking other medications as prescribed. Flu shot given today. Recommended low sodium diet and exercise as tolerated. Keep specialists appointments as recommended. Problem List Items Addressed This Visit     Stage 3b chronic kidney disease (HCC)   Relevant Medications   dapagliflozin propanediol (FARXIGA) 5 MG TABS tablet   Diabetes (HCC)   Relevant Medications   dapagliflozin propanediol (FARXIGA) 5 MG TABS tablet   Other Relevant Orders   POCT CBG (Fasting - Glucose) (Completed)   Hypertension associated with diabetes (HCC) - Primary   Relevant Medications   hydrALAZINE  (APRESOLINE ) 50 MG tablet   dapagliflozin propanediol (FARXIGA) 5 MG TABS tablet   Proteinuria   Combined hyperlipidemia associated with type 2 diabetes mellitus (HCC)   Relevant Medications   hydrALAZINE  (APRESOLINE ) 50 MG tablet   dapagliflozin propanediol (FARXIGA) 5 MG TABS tablet   Exophthalmos of left eye   Flu vaccine need   Relevant Orders   Flu Vaccine Trivalent High Dose (Fluad) (Completed)    Return in about 2 weeks (around 12/16/2023).   Total time spent: 30 minutes  FERNAND FREDY RAMAN, MD  12/02/2023   This document may have been prepared by [facility] Voice Recognition software and as such may include unintentional dictation errors.

## 2023-12-10 DIAGNOSIS — Z961 Presence of intraocular lens: Secondary | ICD-10-CM | POA: Diagnosis not present

## 2023-12-10 DIAGNOSIS — H02536 Eyelid retraction left eye, unspecified eyelid: Secondary | ICD-10-CM | POA: Diagnosis not present

## 2023-12-10 DIAGNOSIS — I10 Essential (primary) hypertension: Secondary | ICD-10-CM | POA: Diagnosis not present

## 2023-12-10 DIAGNOSIS — H40003 Preglaucoma, unspecified, bilateral: Secondary | ICD-10-CM | POA: Diagnosis not present

## 2023-12-12 ENCOUNTER — Encounter

## 2023-12-16 ENCOUNTER — Ambulatory Visit (INDEPENDENT_AMBULATORY_CARE_PROVIDER_SITE_OTHER): Admitting: Internal Medicine

## 2023-12-16 ENCOUNTER — Other Ambulatory Visit: Payer: Self-pay | Admitting: Internal Medicine

## 2023-12-16 ENCOUNTER — Encounter: Payer: Self-pay | Admitting: Internal Medicine

## 2023-12-16 VITALS — BP 140/70 | HR 65 | Ht 60.0 in | Wt 178.4 lb

## 2023-12-16 DIAGNOSIS — E1169 Type 2 diabetes mellitus with other specified complication: Secondary | ICD-10-CM

## 2023-12-16 DIAGNOSIS — I152 Hypertension secondary to endocrine disorders: Secondary | ICD-10-CM | POA: Diagnosis not present

## 2023-12-16 DIAGNOSIS — G4733 Obstructive sleep apnea (adult) (pediatric): Secondary | ICD-10-CM

## 2023-12-16 DIAGNOSIS — E782 Mixed hyperlipidemia: Secondary | ICD-10-CM

## 2023-12-16 DIAGNOSIS — E1159 Type 2 diabetes mellitus with other circulatory complications: Secondary | ICD-10-CM

## 2023-12-16 DIAGNOSIS — E1165 Type 2 diabetes mellitus with hyperglycemia: Secondary | ICD-10-CM | POA: Diagnosis not present

## 2023-12-16 DIAGNOSIS — N1832 Chronic kidney disease, stage 3b: Secondary | ICD-10-CM

## 2023-12-16 DIAGNOSIS — Z6834 Body mass index (BMI) 34.0-34.9, adult: Secondary | ICD-10-CM | POA: Insufficient documentation

## 2023-12-16 LAB — POCT CBG (FASTING - GLUCOSE)-MANUAL ENTRY: Glucose Fasting, POC: 250 mg/dL — AB (ref 70–99)

## 2023-12-16 MED ORDER — TRUEPLUS LANCETS 33G MISC: 2.0000 | Freq: Two times a day (BID) | 1 refills | Status: AC

## 2023-12-16 MED ORDER — TRUE METRIX BLOOD GLUCOSE TEST VI STRP: ORAL_STRIP | 1 refills | Status: AC

## 2023-12-16 MED ORDER — OZEMPIC (0.25 OR 0.5 MG/DOSE) 2 MG/3ML ~~LOC~~ SOPN: 0.2500 mg | PEN_INJECTOR | SUBCUTANEOUS | 1 refills | Status: DC

## 2023-12-16 MED ORDER — DAPAGLIFLOZIN PROPANEDIOL 5 MG PO TABS: 10.0000 mg | ORAL_TABLET | Freq: Every day | ORAL | 3 refills | Status: DC

## 2023-12-16 NOTE — Progress Notes (Signed)
 Established Patient Office Visit  Subjective:  Patient ID: Cheryl Saunders, female    DOB: [birth year]  Age: 78 y.o. MRN: [id number]  Chief Complaint  Patient presents with   Follow-up    2 week follow up    Patient is here today to follow up. She reports she is doing better but continues to have BLE edema. On appearance edema has improved. Recommended wearing compressions stockings daily. Her blood pressure is still elevated today. Will reduce amlodipine to 5 mg daily and increase hydralazine  to 100 mg twice daily. Patient had echo completed 10/2023 and sees her Cardiologist soon to discuss the results of her echo.   Patient reports Ophthalmology determined there was no thyroid  eye disease.  Patient states she was unable to pick iup Farxiga through [facility] due to cost but reports that her supplemental insurance would cover the majority of the cost through [employer]. Sent Farxiga and Ozempic to [facility] as requested by patient.  Patient has no new complaints at this time.    No other concerns at this time.   Past Medical History:  Diagnosis Date   Chronic kidney disease (CKD), active medical management without dialysis, stage 3 (moderate) (HCC)    GERD (gastroesophageal reflux disease)    Heart murmur    followed by PCP   Hyperlipidemia associated with type 2 diabetes mellitus (HCC)    Hypertension    Sleep apnea    supposed to use CPAP. Does not.   Type 2 diabetes mellitus with hyperglycemia [facility])     Past Surgical History:  Procedure Laterality Date   ABDOMINAL HYSTERECTOMY     BREAST BIOPSY Right 11/22/2016    coil clip - PASH   CATARACT EXTRACTION W/ INTRAOCULAR LENS  IMPLANT, BILATERAL  2022   CHOLECYSTECTOMY     COLONOSCOPY WITH PROPOFOL  N/A 10/17/2015   Procedure: COLONOSCOPY WITH PROPOFOL ;  Surgeon: Rogelia Copping, MD;  Location: [facility];  Service: Endoscopy;  Laterality: N/A;  Diabetic - oral meds Sleep apnea   COLONOSCOPY WITH PROPOFOL   N/A 10/03/2020   Procedure: COLONOSCOPY WITH PROPOFOL ;  Surgeon: Copping Rogelia, MD;  Location: [facility];  Service: Endoscopy;  Laterality: N/A;  Diabetic   HERNIA REPAIR     POLYPECTOMY  10/17/2015   Procedure: POLYPECTOMY;  Surgeon: Rogelia Copping, MD;  Location: [facility];  Service: Endoscopy;;  Ascending colon polyps x 2   POLYPECTOMY  10/03/2020   Procedure: POLYPECTOMY;  Surgeon: Copping Rogelia, MD;  Location: [facility];  Service: Endoscopy;;   REPLACEMENT TOTAL KNEE Bilateral     Social History   Socioeconomic History   Marital status: Married    Spouse name: Not on file   Number of children: Not on file   Years of education: Not on file   Highest education level: Not on file  Occupational History   Not on file  Tobacco Use   Smoking status: Never   Smokeless tobacco: Never  Vaping Use   Vaping status: Never Used  Substance and Sexual Activity   Alcohol use: No   Drug use: No   Sexual activity: Not on file  Other Topics Concern   Not on file  Social History Narrative   Not on file   Social Drivers of Health   Financial Resource Strain: Not on file  Food Insecurity: Not on file  Transportation Needs: Not on file  Physical Activity: Not on file  Stress: Not on file  Social Connections: Not on file  Intimate Partner Violence: Not on file    Family History  Problem Relation Age of Onset   Breast cancer Sister 42    No Known Allergies  Outpatient Medications Prior to Visit  Medication Sig   acetaminophen  (TYLENOL ) 500 MG tablet Take 1 tablet (500 mg total) by mouth every 6 (six) hours as needed.   amLODipine (NORVASC) 10 MG tablet Take 10 mg by mouth daily.   aspirin 81 MG tablet Take 81 mg by mouth daily.   benazepril (LOTENSIN) 40 MG tablet Take 40 mg by mouth daily.   chlorthalidone (HYGROTON) 25 MG tablet Take 25 mg by mouth daily.   glipiZIDE (GLUCOTROL XL) 10 MG 24 hr tablet Take 10 mg by mouth 2 (two) times daily. (Patient taking  differently: Take 10 mg by mouth daily with breakfast.)   hydrALAZINE  (APRESOLINE ) 50 MG tablet Take 1 tablet (50 mg total) by mouth in the morning and at bedtime.   metoprolol succinate (TOPROL-XL) 50 MG 24 hr tablet Take 50 mg by mouth daily. Take with or immediately following a meal.   omeprazole (PRILOSEC) 20 MG capsule Take 20 mg by mouth daily.   rosuvastatin  (CRESTOR ) 20 MG tablet Take 1 tablet (20 mg total) by mouth daily.   [DISCONTINUED] OZEMPIC, 0.25 OR 0.5 MG/DOSE, 2 MG/3ML SOPN SMARTSIG:0.25 Milligram(s) SUB-Q Once a Week   [DISCONTINUED] TRUE METRIX BLOOD GLUCOSE TEST test strip SMARTSIG:Via Meter   [DISCONTINUED] TRUEplus Lancets 33G MISC SMARTSIG:Lancet Topical   [DISCONTINUED] dapagliflozin propanediol (FARXIGA) 5 MG TABS tablet Take 1 tablet (5 mg total) by mouth daily. (Patient not taking: Reported on 12/16/2023)   No facility-administered medications prior to visit.    Review of Systems  Constitutional: Negative.  Negative for chills, fever and malaise/fatigue.  HENT: Negative.  Negative for congestion and sore throat.   Eyes: Negative.  Negative for blurred vision and pain.  Respiratory: Negative.  Negative for cough and shortness of breath.   Cardiovascular:  Positive for leg swelling (BLE). Negative for chest pain and palpitations.  Gastrointestinal: Negative.  Negative for abdominal pain, blood in stool, constipation, diarrhea, heartburn, melena, nausea and vomiting.  Genitourinary: Negative.  Negative for dysuria, flank pain, frequency and urgency.  Musculoskeletal: Negative.  Negative for joint pain and myalgias.  Skin: Negative.   Neurological: Negative.  Negative for dizziness, tingling, sensory change, weakness and headaches.  Endo/Heme/Allergies: Negative.   Psychiatric/Behavioral: Negative.  Negative for depression and suicidal ideas. The patient is not nervous/anxious.        Objective:   BP (!) 140/70   Pulse 65   Ht 5' (1.524 m)   Wt 178 lb 6.4 oz  (80.9 kg)   SpO2 97%   BMI 34.84 kg/m   Vitals:   12/16/23 1126  BP: (!) 140/70  Pulse: 65  Height: 5' (1.524 m)  Weight: 178 lb 6.4 oz (80.9 kg)  SpO2: 97%  BMI (Calculated): 34.84    Physical Exam Vitals and nursing note reviewed.  Constitutional:      Appearance: Normal appearance.  HENT:     Head: Normocephalic and atraumatic.     Nose: Nose normal.     Mouth/Throat:     Mouth: Mucous membranes are moist.     Pharynx: Oropharynx is clear.  Eyes:     Conjunctiva/sclera: Conjunctivae normal.     Pupils: Pupils are equal, round, and reactive to light.  Cardiovascular:     Rate and Rhythm: Normal rate and regular rhythm.     Pulses:  Normal pulses.     Heart sounds: Normal heart sounds. No murmur heard. Pulmonary:     Effort: Pulmonary effort is normal.     Breath sounds: Normal breath sounds. No wheezing.  Abdominal:     General: Bowel sounds are normal.     Palpations: Abdomen is soft.     Tenderness: There is no abdominal tenderness. There is no right CVA tenderness or left CVA tenderness.  Musculoskeletal:        General: Normal range of motion.     Cervical back: Normal range of motion.     Right lower leg: 1+ Pitting Edema present.     Left lower leg: 1+ Pitting Edema present.  Skin:    General: Skin is warm and dry.  Neurological:     General: No focal deficit present.     Mental Status: She is alert and oriented to person, place, and time.  Psychiatric:        Mood and Affect: Mood normal.        Behavior: Behavior normal.      Results for orders placed or performed in visit on 12/16/23  POCT CBG (Fasting - Glucose)  Result Value Ref Range   Glucose Fasting, POC 250 (A) 70 - 99 mg/dL    Recent Results (from the past 2160 hours)  POCT CBG (Fasting - Glucose)     Status: Abnormal   Collection Time: 11/08/23  1:16 PM  Result Value Ref Range   Glucose Fasting, POC 64 (A) 70 - 99 mg/dL  CBC with Diff     Status: Abnormal   Collection Time:  11/08/23  2:22 PM  Result Value Ref Range   WBC 8.5 3.4 - 10.8 x10E3/uL   RBC 3.89 3.77 - 5.28 x10E6/uL   Hemoglobin 11.0 (L) 11.1 - 15.9 g/dL   Hematocrit 64.6 65.9 - 46.6 %   MCV 91 79 - 97 fL   MCH 28.3 26.6 - 33.0 pg   MCHC 31.2 (L) 31.5 - 35.7 g/dL   RDW 86.7 88.2 - 84.5 %   Platelets 327 150 - 450 x10E3/uL   Neutrophils 67 Not Estab. %   Lymphs 21 Not Estab. %   Monocytes 9 Not Estab. %   Eos 2 Not Estab. %   Basos 1 Not Estab. %   Neutrophils Absolute 5.7 1.4 - 7.0 x10E3/uL   Lymphocytes Absolute 1.8 0.7 - 3.1 x10E3/uL   Monocytes Absolute 0.8 0.1 - 0.9 x10E3/uL   EOS (ABSOLUTE) 0.2 0.0 - 0.4 x10E3/uL   Basophils Absolute 0.1 0.0 - 0.2 x10E3/uL   Immature Granulocytes 0 Not Estab. %   Immature Grans (Abs) 0.0 0.0 - 0.1 x10E3/uL  CMP14+EGFR     Status: Abnormal   Collection Time: 11/08/23  2:22 PM  Result Value Ref Range   Glucose 55 (L) 70 - 99 mg/dL   BUN 23 8 - 27 mg/dL   Creatinine, Ser 8.47 (H) 0.57 - 1.00 mg/dL   eGFR 35 (L) >40 fO/fpw/8.26   BUN/Creatinine Ratio 15 12 - 28   Sodium 141 134 - 144 mmol/L   Potassium 3.9 3.5 - 5.2 mmol/L   Chloride 103 96 - 106 mmol/L   CO2 24 20 - 29 mmol/L   Calcium  9.0 8.7 - 10.3 mg/dL   Total Protein 6.6 6.0 - 8.5 g/dL   Albumin 4.3 3.8 - 4.8 g/dL   Globulin, Total 2.3 1.5 - 4.5 g/dL   Bilirubin Total 0.2 0.0 - 1.2 mg/dL  Alkaline Phosphatase 111 44 - 121 IU/L    Comment: **Effective November 11, 2023 Alkaline Phosphatase**   reference interval will be changing to:              Age                Female          Female           0 -  5 days         47 - 127       47 - 127           6 - 10 days         29 - 242       29 - 242          11 - 20 days        109 - 357      109 - 357          21 - 30 days         94 - 494       94 - 494           1 -  2 months      149 - 539      149 - 539           3 -  6 months      131 - 452      131 - 452           7 - 11 months      117 - 401      117 - 401   12 months -  6 years        158 - 369      158 - 369           7 - 12 years       150 - 409      150 - 409               13 years       156 - 435       78 - 227               14 years       114 - 375       64 - 161               15 years        88 - 279       56 - 134               16 years        74 - 207       51 - 121               17 years        63 - 161       47 - 113          18 - 20 years        51 - 125       42 - 106          21 - 50 years         47 - 123       41 - 116          51 - 80 years  49 - 135       51 - 125              >80 years        48 - 129       48 - 129    AST 13 0 - 40 IU/L   ALT 9 0 - 32 IU/L  Lipid Panel w/o Chol/HDL Ratio     Status: Abnormal   Collection Time: 11/08/23  2:22 PM  Result Value Ref Range   Cholesterol, Total 161 100 - 199 mg/dL   Triglycerides 794 (H) 0 - 149 mg/dL   HDL 42 >60 mg/dL   VLDL Cholesterol Cal 35 5 - 40 mg/dL   LDL Chol Calc (NIH) 84 0 - 99 mg/dL  Hemoglobin J8r     Status: Abnormal   Collection Time: 11/08/23  2:22 PM  Result Value Ref Range   Hgb A1c MFr Bld 6.8 (H) 4.8 - 5.6 %    Comment:          Prediabetes: 5.7 - 6.4          Diabetes: >6.4          Glycemic control for adults with diabetes: <7.0    Est. average glucose Bld gHb Est-mCnc 148 mg/dL  UDY+[id number]     Status: None   Collection Time: 11/08/23  2:22 PM  Result Value Ref Range   TSH 0.904 0.450 - 4.500 uIU/mL   T3, Free 2.7 2.0 - 4.4 pg/mL   Free T4 1.39 0.82 - 1.77 ng/dL  Microalbumin / creatinine urine ratio     Status: None   Collection Time: 11/14/23 12:00 AM  Result Value Ref Range   Microalb Creat Ratio 598.5     Comment: collected at [facility] Nephologist office  T4, free     Status: None   Collection Time: 11/15/23 12:02 PM  Result Value Ref Range   Free T4 1.05 0.61 - 1.12 ng/dL    Comment: (NOTE) Biotin ingestion may interfere with free T4 tests. If the results are inconsistent with the TSH level, previous test results, or the clinical presentation, then  consider biotin interference. If needed, order repeat testing after stopping biotin. Performed at [facility], [address] [place], [place] [address]   TSH     Status: None   Collection Time: 11/15/23 12:02 PM  Result Value Ref Range   TSH 0.910 0.350 - 4.500 uIU/mL    Comment: Performed by a 3rd Generation assay with a functional sensitivity of <=0.01 uIU/mL. Performed at [facility], [address] [place], [place] [address]   T3, free     Status: None   Collection Time: 11/15/23 12:02 PM  Result Value Ref Range   T3, Free 2.9 2.0 - 4.4 pg/mL    Comment: (NOTE) Performed At: [facility] [address] [place], [place] [id number] Jennette Shorter MD Ey:[id number]   Thyroid  stimulating immunoglobulin     Status: None   Collection Time: 11/15/23 12:02 PM  Result Value Ref Range   Thyroid  Stimulating Immunoglob <0.10 0.00 - 0.55 IU/L    Comment: (NOTE) Performed At: [facility] [address] [place], [place] [id number] Jennette Shorter MD Ey:[id number]   POCT CBG (Fasting - Glucose)     Status: Abnormal   Collection Time: 12/02/23 11:14 AM  Result Value Ref Range   Glucose Fasting, POC 280 (A) 70 - 99 mg/dL  POCT CBG (Fasting - Glucose)  Status: Abnormal   Collection Time: 12/16/23 11:32 AM  Result Value Ref Range   Glucose Fasting, POC 250 (A) 70 - 99 mg/dL      Assessment & Plan:  Start taking hydralazine  100 mg twice daily. Reduce Amlodipine to 5 mg daily. Continue taking other medications as prescribed. Reinforced sodium reduction. Recommended compression stockings daily and elevate feet throughout the day as needed. Refills sent for Farxiga and Ozempic to alternative pharmacy. Refills sent for lancets and test strips to her primary pharmacy. Problem List Items Addressed This Visit     Stage 3b chronic kidney disease (HCC)   Relevant Medications   dapagliflozin propanediol (FARXIGA) 5 MG TABS tablet   OZEMPIC, 0.25 OR 0.5  MG/DOSE, 2 MG/3ML SOPN   Diabetes (HCC) - Primary   Relevant Medications   dapagliflozin propanediol (FARXIGA) 5 MG TABS tablet   OZEMPIC, 0.25 OR 0.5 MG/DOSE, 2 MG/3ML SOPN   TRUE METRIX BLOOD GLUCOSE TEST test strip   TRUEplus Lancets 33G MISC   Other Relevant Orders   POCT CBG (Fasting - Glucose) (Completed)   Hypertension associated with diabetes (HCC)   Relevant Medications   dapagliflozin propanediol (FARXIGA) 5 MG TABS tablet   OZEMPIC, 0.25 OR 0.5 MG/DOSE, 2 MG/3ML SOPN   OSA (obstructive sleep apnea)   Combined hyperlipidemia associated with type 2 diabetes mellitus (HCC)   Relevant Medications   dapagliflozin propanediol (FARXIGA) 5 MG TABS tablet   OZEMPIC, 0.25 OR 0.5 MG/DOSE, 2 MG/3ML SOPN   Body mass index (BMI) of 34.0-34.9 in adult    Return in about 2 weeks (around 12/30/2023).   Total time spent: 25 minutes. This time includes review of previous notes and results and patient face to face interaction during today's visit.    FERNAND FREDY RAMAN, MD  12/16/2023   This document may have been prepared by [facility] Voice Recognition software and as such may include unintentional dictation errors.

## 2023-12-17 ENCOUNTER — Ambulatory Visit: Admitting: Cardiovascular Disease

## 2023-12-30 ENCOUNTER — Ambulatory Visit: Payer: Self-pay | Admitting: Internal Medicine

## 2023-12-30 ENCOUNTER — Ambulatory Visit (INDEPENDENT_AMBULATORY_CARE_PROVIDER_SITE_OTHER): Admitting: Internal Medicine

## 2023-12-30 ENCOUNTER — Encounter: Payer: Self-pay | Admitting: Internal Medicine

## 2023-12-30 VITALS — BP 144/72 | HR 76 | Ht 60.0 in | Wt 174.0 lb

## 2023-12-30 DIAGNOSIS — E1159 Type 2 diabetes mellitus with other circulatory complications: Secondary | ICD-10-CM

## 2023-12-30 DIAGNOSIS — E1165 Type 2 diabetes mellitus with hyperglycemia: Secondary | ICD-10-CM

## 2023-12-30 DIAGNOSIS — E1169 Type 2 diabetes mellitus with other specified complication: Secondary | ICD-10-CM

## 2023-12-30 DIAGNOSIS — E782 Mixed hyperlipidemia: Secondary | ICD-10-CM | POA: Diagnosis not present

## 2023-12-30 DIAGNOSIS — Z1231 Encounter for screening mammogram for malignant neoplasm of breast: Secondary | ICD-10-CM | POA: Diagnosis not present

## 2023-12-30 DIAGNOSIS — I152 Hypertension secondary to endocrine disorders: Secondary | ICD-10-CM | POA: Diagnosis not present

## 2023-12-30 DIAGNOSIS — N1832 Chronic kidney disease, stage 3b: Secondary | ICD-10-CM | POA: Diagnosis not present

## 2023-12-30 DIAGNOSIS — Z6834 Body mass index (BMI) 34.0-34.9, adult: Secondary | ICD-10-CM | POA: Diagnosis not present

## 2023-12-30 LAB — POCT CBG (FASTING - GLUCOSE)-MANUAL ENTRY: Glucose Fasting, POC: 105 mg/dL — AB (ref 70–99)

## 2023-12-30 MED ORDER — HYDRALAZINE HCL 100 MG PO TABS: 100.0000 mg | ORAL_TABLET | Freq: Two times a day (BID) | ORAL | 3 refills | Status: AC

## 2023-12-30 NOTE — Progress Notes (Signed)
 Established Patient Office Visit  Subjective:  Patient ID: Cheryl Saunders, female    DOB: Feb 27, 1945  Age: 78 y.o. MRN: [id number]  Chief Complaint  Patient presents with   Follow-up    2 week follow up    Patient is here today for follow up. Her BP is still elevated today. She reports taking medications as prescribed. BLE edema has resolved, since Amlodipine dose was reduced Reinforced heart healthy diet and exercise as tolerated. She has began taking Farxiga and Ozempic as prescribed since her lat appointment. Her fasting blood sugar today is well controlled. Will check labs in 6 weeks and adjust medications at that time as needed.  She denies headache, chest pain, shortness of breath, palpitations, abdominal distress at this time.  Patient is due for mammogram, will order today to get completed.    No other concerns at this time.   Past Medical History:  Diagnosis Date   Chronic kidney disease (CKD), active medical management without dialysis, stage 3 (moderate) (HCC)    GERD (gastroesophageal reflux disease)    Heart murmur    followed by PCP   Hyperlipidemia associated with type 2 diabetes mellitus (HCC)    Hypertension    Sleep apnea    supposed to use CPAP. Does not.   Type 2 diabetes mellitus with hyperglycemia [facility])     Past Surgical History:  Procedure Laterality Date   ABDOMINAL HYSTERECTOMY     BREAST BIOPSY Right 11/22/2016    coil clip - PASH   CATARACT EXTRACTION W/ INTRAOCULAR LENS  IMPLANT, BILATERAL  2022   CHOLECYSTECTOMY     COLONOSCOPY WITH PROPOFOL  N/A 10/17/2015   Procedure: COLONOSCOPY WITH PROPOFOL ;  Surgeon: Rogelia Copping, MD;  Location: [facility];  Service: Endoscopy;  Laterality: N/A;  Diabetic - oral meds Sleep apnea   COLONOSCOPY WITH PROPOFOL  N/A 10/03/2020   Procedure: COLONOSCOPY WITH PROPOFOL ;  Surgeon: Copping Rogelia, MD;  Location: [facility];  Service: Endoscopy;  Laterality: N/A;  Diabetic   HERNIA REPAIR      POLYPECTOMY  10/17/2015   Procedure: POLYPECTOMY;  Surgeon: Rogelia Copping, MD;  Location: [facility];  Service: Endoscopy;;  Ascending colon polyps x 2   POLYPECTOMY  10/03/2020   Procedure: POLYPECTOMY;  Surgeon: Copping Rogelia, MD;  Location: [facility];  Service: Endoscopy;;   REPLACEMENT TOTAL KNEE Bilateral     Social History   Socioeconomic History   Marital status: Married    Spouse name: Not on file   Number of children: Not on file   Years of education: Not on file   Highest education level: Not on file  Occupational History   Not on file  Tobacco Use   Smoking status: Never   Smokeless tobacco: Never  Vaping Use   Vaping status: Never Used  Substance and Sexual Activity   Alcohol use: No   Drug use: No   Sexual activity: Not on file  Other Topics Concern   Not on file  Social History Narrative   Not on file   Social Drivers of Health   Financial Resource Strain: Not on file  Food Insecurity: Not on file  Transportation Needs: Not on file  Physical Activity: Not on file  Stress: Not on file  Social Connections: Not on file  Intimate Partner Violence: Not on file    Family History  Problem Relation Age of Onset   Breast cancer Sister 97    No Known Allergies  Outpatient Medications  Prior to Visit  Medication Sig   acetaminophen  (TYLENOL ) 500 MG tablet Take 1 tablet (500 mg total) by mouth every 6 (six) hours as needed.   amLODipine (NORVASC) 10 MG tablet Take 5 mg by mouth daily.   aspirin 81 MG tablet Take 81 mg by mouth daily.   benazepril (LOTENSIN) 40 MG tablet Take 40 mg by mouth daily.   chlorthalidone (HYGROTON) 25 MG tablet Take 25 mg by mouth daily.   dapagliflozin propanediol (FARXIGA) 10 MG TABS tablet Take 1 tablet (10 mg total) by mouth daily.   glipiZIDE (GLUCOTROL XL) 10 MG 24 hr tablet Take 10 mg by mouth 2 (two) times daily. (Patient taking differently: Take 10 mg by mouth daily with breakfast.)   metoprolol succinate  (TOPROL-XL) 50 MG 24 hr tablet Take 50 mg by mouth daily. Take with or immediately following a meal.   omeprazole (PRILOSEC) 20 MG capsule Take 20 mg by mouth daily.   OZEMPIC, 0.25 OR 0.5 MG/DOSE, 2 MG/3ML SOPN Inject 0.25 mg into the skin once a week.   rosuvastatin  (CRESTOR ) 20 MG tablet Take 1 tablet (20 mg total) by mouth daily.   TRUE METRIX BLOOD GLUCOSE TEST test strip Use as instructed   TRUEplus Lancets 33G MISC Inject 2 each into the skin 2 (two) times daily.   [DISCONTINUED] hydrALAZINE  (APRESOLINE ) 50 MG tablet Take 1 tablet (50 mg total) by mouth in the morning and at bedtime.   No facility-administered medications prior to visit.    Review of Systems  Constitutional: Negative.  Negative for chills, fever and malaise/fatigue.  HENT: Negative.  Negative for congestion and sore throat.   Eyes: Negative.  Negative for blurred vision and pain.  Respiratory: Negative.  Negative for cough and shortness of breath.   Cardiovascular: Negative.  Negative for chest pain, palpitations and leg swelling.  Gastrointestinal: Negative.  Negative for abdominal pain, blood in stool, constipation, diarrhea, heartburn, melena, nausea and vomiting.  Genitourinary: Negative.  Negative for dysuria, flank pain, frequency and urgency.  Musculoskeletal: Negative.  Negative for joint pain and myalgias.  Skin: Negative.   Neurological: Negative.  Negative for dizziness, tingling, sensory change, weakness and headaches.  Endo/Heme/Allergies: Negative.   Psychiatric/Behavioral: Negative.  Negative for depression and suicidal ideas. The patient is not nervous/anxious.        Objective:   BP (!) 144/72   Pulse 76   Ht 5' (1.524 m)   Wt 174 lb (78.9 kg)   SpO2 98%   BMI 33.98 kg/m   Vitals:   12/30/23 1100  BP: (!) 144/72  Pulse: 76  Height: 5' (1.524 m)  Weight: 174 lb (78.9 kg)  SpO2: 98%  BMI (Calculated): 33.98    Physical Exam Vitals and nursing note reviewed.  Constitutional:       Appearance: Normal appearance.  HENT:     Head: Normocephalic and atraumatic.     Nose: Nose normal.     Mouth/Throat:     Mouth: Mucous membranes are moist.     Pharynx: Oropharynx is clear.  Eyes:     Conjunctiva/sclera: Conjunctivae normal.     Pupils: Pupils are equal, round, and reactive to light.  Cardiovascular:     Rate and Rhythm: Normal rate and regular rhythm.     Pulses: Normal pulses.     Heart sounds: Normal heart sounds. No murmur heard. Pulmonary:     Effort: Pulmonary effort is normal.     Breath sounds: Normal breath sounds. No wheezing.  Abdominal:     General: Bowel sounds are normal.     Palpations: Abdomen is soft.     Tenderness: There is no abdominal tenderness. There is no right CVA tenderness or left CVA tenderness.  Musculoskeletal:        General: Normal range of motion.     Cervical back: Normal range of motion.     Right lower leg: No edema.     Left lower leg: No edema.  Skin:    General: Skin is warm and dry.  Neurological:     General: No focal deficit present.     Mental Status: She is alert and oriented to person, place, and time.  Psychiatric:        Mood and Affect: Mood normal.        Behavior: Behavior normal.      Results for orders placed or performed in visit on 12/30/23  POCT CBG (Fasting - Glucose)  Result Value Ref Range   Glucose Fasting, POC 105 (A) 70 - 99 mg/dL    Recent Results (from the past 2160 hours)  POCT CBG (Fasting - Glucose)     Status: Abnormal   Collection Time: 11/08/23  1:16 PM  Result Value Ref Range   Glucose Fasting, POC 64 (A) 70 - 99 mg/dL  CBC with Diff     Status: Abnormal   Collection Time: 11/08/23  2:22 PM  Result Value Ref Range   WBC 8.5 3.4 - 10.8 x10E3/uL   RBC 3.89 3.77 - 5.28 x10E6/uL   Hemoglobin 11.0 (L) 11.1 - 15.9 g/dL   Hematocrit 64.6 65.9 - 46.6 %   MCV 91 79 - 97 fL   MCH 28.3 26.6 - 33.0 pg   MCHC 31.2 (L) 31.5 - 35.7 g/dL   RDW 86.7 88.2 - 84.5 %   Platelets 327 150 -  450 x10E3/uL   Neutrophils 67 Not Estab. %   Lymphs 21 Not Estab. %   Monocytes 9 Not Estab. %   Eos 2 Not Estab. %   Basos 1 Not Estab. %   Neutrophils Absolute 5.7 1.4 - 7.0 x10E3/uL   Lymphocytes Absolute 1.8 0.7 - 3.1 x10E3/uL   Monocytes Absolute 0.8 0.1 - 0.9 x10E3/uL   EOS (ABSOLUTE) 0.2 0.0 - 0.4 x10E3/uL   Basophils Absolute 0.1 0.0 - 0.2 x10E3/uL   Immature Granulocytes 0 Not Estab. %   Immature Grans (Abs) 0.0 0.0 - 0.1 x10E3/uL  CMP14+EGFR     Status: Abnormal   Collection Time: 11/08/23  2:22 PM  Result Value Ref Range   Glucose 55 (L) 70 - 99 mg/dL   BUN 23 8 - 27 mg/dL   Creatinine, Ser 8.47 (H) 0.57 - 1.00 mg/dL   eGFR 35 (L) >40 fO/fpw/8.26   BUN/Creatinine Ratio 15 12 - 28   Sodium 141 134 - 144 mmol/L   Potassium 3.9 3.5 - 5.2 mmol/L   Chloride 103 96 - 106 mmol/L   CO2 24 20 - 29 mmol/L   Calcium  9.0 8.7 - 10.3 mg/dL   Total Protein 6.6 6.0 - 8.5 g/dL   Albumin 4.3 3.8 - 4.8 g/dL   Globulin, Total 2.3 1.5 - 4.5 g/dL   Bilirubin Total 0.2 0.0 - 1.2 mg/dL   Alkaline Phosphatase 111 44 - 121 IU/L    Comment: **Effective November 11, 2023 Alkaline Phosphatase**   reference interval will be changing to:              Age  Female          Female           0 -  5 days         47 - 127       47 - 127           6 - 10 days         29 - 242       29 - 242          11 - 20 days        109 - 357      109 - 357          21 - 30 days         94 - 494       94 - 494           1 -  2 months      149 - 539      149 - 539           3 -  6 months      131 - 452      131 - 452           7 - 11 months      117 - 401      117 - 401   12 months -  6 years       158 - 369      158 - 369           7 - 12 years       150 - 409      150 - 409               13 years       156 - 435       78 - 227               14 years       114 - 375       64 - 161               15 years        88 - 279       56 - 134               16 years        74 - 207       51 - 121                17 years        63 - 161       47 - 113          18 - 20 years        51 - 125       42 - 106          21 - 50 years         47 - 123       41 - 116          51 - 80 years        49 - 135       51 - 125              >80 years        48 - 129       48 - 129  AST 13 0 - 40 IU/L   ALT 9 0 - 32 IU/L  Lipid Panel w/o Chol/HDL Ratio     Status: Abnormal   Collection Time: 11/08/23  2:22 PM  Result Value Ref Range   Cholesterol, Total 161 100 - 199 mg/dL   Triglycerides 794 (H) 0 - 149 mg/dL   HDL 42 >60 mg/dL   VLDL Cholesterol Cal 35 5 - 40 mg/dL   LDL Chol Calc (NIH) 84 0 - 99 mg/dL  Hemoglobin J8r     Status: Abnormal   Collection Time: 11/08/23  2:22 PM  Result Value Ref Range   Hgb A1c MFr Bld 6.8 (H) 4.8 - 5.6 %    Comment:          Prediabetes: 5.7 - 6.4          Diabetes: >6.4          Glycemic control for adults with diabetes: <7.0    Est. average glucose Bld gHb Est-mCnc 148 mg/dL  UDY+[id number]     Status: None   Collection Time: 11/08/23  2:22 PM  Result Value Ref Range   TSH 0.904 0.450 - 4.500 uIU/mL   T3, Free 2.7 2.0 - 4.4 pg/mL   Free T4 1.39 0.82 - 1.77 ng/dL  Microalbumin / creatinine urine ratio     Status: None   Collection Time: 11/14/23 12:00 AM  Result Value Ref Range   Microalb Creat Ratio 598.5     Comment: collected at [facility] Nephologist office  T4, free     Status: None   Collection Time: 11/15/23 12:02 PM  Result Value Ref Range   Free T4 1.05 0.61 - 1.12 ng/dL    Comment: (NOTE) Biotin ingestion may interfere with free T4 tests. If the results are inconsistent with the TSH level, previous test results, or the clinical presentation, then consider biotin interference. If needed, order repeat testing after stopping biotin. Performed at [facility], [address] [place], [place] [address]   TSH     Status: None   Collection Time: 11/15/23 12:02 PM  Result Value Ref Range   TSH 0.910 0.350 - 4.500 uIU/mL    Comment: Performed by  a 3rd Generation assay with a functional sensitivity of <=0.01 uIU/mL. Performed at [facility], [address] [place], [place] [address]   T3, free     Status: None   Collection Time: 11/15/23 12:02 PM  Result Value Ref Range   T3, Free 2.9 2.0 - 4.4 pg/mL    Comment: (NOTE) Performed At: [facility] [address] [place], [place] [id number] Jennette Shorter MD Ey:[phone]   Thyroid  stimulating immunoglobulin     Status: None   Collection Time: 11/15/23 12:02 PM  Result Value Ref Range   Thyroid  Stimulating Immunoglob <0.10 0.00 - 0.55 IU/L    Comment: (NOTE) Performed At: [facility] [address] [place], [place] [id number] Jennette Shorter MD Ey:[phone]   POCT CBG (Fasting - Glucose)     Status: Abnormal   Collection Time: 12/02/23 11:14 AM  Result Value Ref Range   Glucose Fasting, POC 280 (A) 70 - 99 mg/dL  POCT CBG (Fasting - Glucose)     Status: Abnormal   Collection Time: 12/16/23 11:32 AM  Result Value Ref Range   Glucose Fasting, POC 250 (A) 70 - 99 mg/dL  POCT CBG (Fasting - Glucose)     Status: Abnormal   Collection Time: 12/30/23 11:09 AM  Result Value Ref  Range   Glucose Fasting, POC 105 (A) 70 - 99 mg/dL      Assessment & Plan:  Continue taking medications as prescribed. Check labs at FU appointment and make adjustments to plan of care as needed. Monitor BP. Reinforced healthy diet and exercise as tolerated. Mammogram ordered. Problem List Items Addressed This Visit     Stage 3b chronic kidney disease (HCC)   Diabetes (HCC) - Primary   Relevant Orders   POCT CBG (Fasting - Glucose) (Completed)   Hemoglobin A1c   Hypertension associated with diabetes (HCC)   Relevant Medications   hydrALAZINE  (APRESOLINE ) 100 MG tablet   Other Relevant Orders   CBC with Differential/Platelet   CMP14+EGFR   Combined hyperlipidemia associated with type 2 diabetes mellitus (HCC)   Relevant Medications   hydrALAZINE  (APRESOLINE ) 100 MG  tablet   Other Relevant Orders   Lipid Panel w/o Chol/HDL Ratio   Body mass index (BMI) of 34.0-34.9 in adult   Breast cancer screening by mammogram   Relevant Orders   MM 3D SCREENING MAMMOGRAM BILATERAL BREAST    Return in about 6 weeks (around 02/10/2024).   Total time spent: 25 minutes. This time includes review of previous notes and results and patient face to face interaction during today's visit.    FERNAND FREDY RAMAN, MD  12/30/2023   This document may have been prepared by [facility] Voice Recognition software and as such may include unintentional dictation errors.

## 2024-01-09 ENCOUNTER — Ambulatory Visit

## 2024-01-09 DIAGNOSIS — E782 Mixed hyperlipidemia: Secondary | ICD-10-CM

## 2024-01-09 DIAGNOSIS — I340 Nonrheumatic mitral (valve) insufficiency: Secondary | ICD-10-CM | POA: Diagnosis not present

## 2024-01-09 DIAGNOSIS — N1832 Chronic kidney disease, stage 3b: Secondary | ICD-10-CM

## 2024-01-09 DIAGNOSIS — E1159 Type 2 diabetes mellitus with other circulatory complications: Secondary | ICD-10-CM | POA: Diagnosis not present

## 2024-01-09 DIAGNOSIS — R0602 Shortness of breath: Secondary | ICD-10-CM

## 2024-01-09 DIAGNOSIS — E1169 Type 2 diabetes mellitus with other specified complication: Secondary | ICD-10-CM

## 2024-01-09 DIAGNOSIS — E1165 Type 2 diabetes mellitus with hyperglycemia: Secondary | ICD-10-CM

## 2024-01-15 ENCOUNTER — Other Ambulatory Visit: Payer: Self-pay

## 2024-01-15 MED ORDER — AMLODIPINE BESYLATE 10 MG PO TABS: 5.0000 mg | ORAL_TABLET | Freq: Every day | ORAL | 1 refills | Status: AC

## 2024-01-15 MED ORDER — CHLORTHALIDONE 25 MG PO TABS: 25.0000 mg | ORAL_TABLET | Freq: Every day | ORAL | 1 refills | Status: AC

## 2024-01-16 ENCOUNTER — Encounter: Payer: Self-pay | Admitting: Cardiovascular Disease

## 2024-01-16 ENCOUNTER — Ambulatory Visit (INDEPENDENT_AMBULATORY_CARE_PROVIDER_SITE_OTHER): Admitting: Cardiovascular Disease

## 2024-01-16 VITALS — BP 111/78 | HR 55 | Ht 60.0 in | Wt 172.8 lb

## 2024-01-16 DIAGNOSIS — Z6834 Body mass index (BMI) 34.0-34.9, adult: Secondary | ICD-10-CM | POA: Diagnosis not present

## 2024-01-16 DIAGNOSIS — G4733 Obstructive sleep apnea (adult) (pediatric): Secondary | ICD-10-CM | POA: Diagnosis not present

## 2024-01-16 DIAGNOSIS — E782 Mixed hyperlipidemia: Secondary | ICD-10-CM | POA: Diagnosis not present

## 2024-01-16 DIAGNOSIS — I5033 Acute on chronic diastolic (congestive) heart failure: Secondary | ICD-10-CM

## 2024-01-16 DIAGNOSIS — I340 Nonrheumatic mitral (valve) insufficiency: Secondary | ICD-10-CM

## 2024-01-16 DIAGNOSIS — E1169 Type 2 diabetes mellitus with other specified complication: Secondary | ICD-10-CM | POA: Diagnosis not present

## 2024-01-16 DIAGNOSIS — R0602 Shortness of breath: Secondary | ICD-10-CM

## 2024-01-16 DIAGNOSIS — E1159 Type 2 diabetes mellitus with other circulatory complications: Secondary | ICD-10-CM

## 2024-01-16 DIAGNOSIS — N1832 Chronic kidney disease, stage 3b: Secondary | ICD-10-CM | POA: Diagnosis not present

## 2024-01-16 DIAGNOSIS — I152 Hypertension secondary to endocrine disorders: Secondary | ICD-10-CM

## 2024-01-16 NOTE — Progress Notes (Signed)
 Cardiology Office Note   Date:  01/16/2024   ID:  Cheryl Saunders, DOB 1945/08/26, MRN [id number]  PCP:  Fernand Fredy RAMAN, MD  Cardiologist:  Denyse Fernand, MD      History of Present Illness: Cheryl Saunders is a 78 y.o. female who presents for  Chief Complaint  Patient presents with   Follow-up    4 week follow up with NST/echo results     HAS DOE      Past Medical History:  Diagnosis Date   Chronic kidney disease (CKD), active medical management without dialysis, stage 3 (moderate) (HCC)    GERD (gastroesophageal reflux disease)    Heart murmur    followed by PCP   Hyperlipidemia associated with type 2 diabetes mellitus (HCC)    Hypertension    Sleep apnea    supposed to use CPAP. Does not.   Type 2 diabetes mellitus with hyperglycemia [facility])      Past Surgical History:  Procedure Laterality Date   ABDOMINAL HYSTERECTOMY     BREAST BIOPSY Right 11/22/2016    coil clip - PASH   CATARACT EXTRACTION W/ INTRAOCULAR LENS  IMPLANT, BILATERAL  2022   CHOLECYSTECTOMY     COLONOSCOPY WITH PROPOFOL  N/A 10/17/2015   Procedure: COLONOSCOPY WITH PROPOFOL ;  Surgeon: Rogelia Copping, MD;  Location: [facility];  Service: Endoscopy;  Laterality: N/A;  Diabetic - oral meds Sleep apnea   COLONOSCOPY WITH PROPOFOL  N/A 10/03/2020   Procedure: COLONOSCOPY WITH PROPOFOL ;  Surgeon: Copping Rogelia, MD;  Location: [facility];  Service: Endoscopy;  Laterality: N/A;  Diabetic   HERNIA REPAIR     POLYPECTOMY  10/17/2015   Procedure: POLYPECTOMY;  Surgeon: Rogelia Copping, MD;  Location: [facility];  Service: Endoscopy;;  Ascending colon polyps x 2   POLYPECTOMY  10/03/2020   Procedure: POLYPECTOMY;  Surgeon: Copping Rogelia, MD;  Location: [facility];  Service: Endoscopy;;   REPLACEMENT TOTAL KNEE Bilateral      Current Outpatient Medications  Medication Sig Dispense Refill   acetaminophen  (TYLENOL ) 500 MG tablet Take 1 tablet (500 mg total) by mouth every 6 (six)  hours as needed. 30 tablet 0   amLODipine (NORVASC) 10 MG tablet Take 0.5 tablets (5 mg total) by mouth daily. 90 tablet 1   aspirin 81 MG tablet Take 81 mg by mouth daily.     benazepril (LOTENSIN) 40 MG tablet Take 40 mg by mouth daily.     chlorthalidone (HYGROTON) 25 MG tablet Take 1 tablet (25 mg total) by mouth daily. 90 tablet 1   dapagliflozin propanediol (FARXIGA) 10 MG TABS tablet Take 1 tablet (10 mg total) by mouth daily. 90 tablet 3   glipiZIDE (GLUCOTROL XL) 10 MG 24 hr tablet Take 10 mg by mouth 2 (two) times daily. (Patient taking differently: Take 10 mg by mouth daily with breakfast.)     hydrALAZINE  (APRESOLINE ) 100 MG tablet Take 1 tablet (100 mg total) by mouth in the morning and at bedtime. 90 tablet 3   metoprolol succinate (TOPROL-XL) 50 MG 24 hr tablet Take 50 mg by mouth daily. Take with or immediately following a meal.     omeprazole (PRILOSEC) 20 MG capsule Take 20 mg by mouth daily.     OZEMPIC, 0.25 OR 0.5 MG/DOSE, 2 MG/3ML SOPN Inject 0.25 mg into the skin once a week. 2 mL 1   rosuvastatin  (CRESTOR ) 20 MG tablet Take 1 tablet (20 mg total) by mouth daily. 90 tablet  3   TRUE METRIX BLOOD GLUCOSE TEST test strip Use as instructed 100 each 1   TRUEplus Lancets 33G MISC Inject 2 each into the skin 2 (two) times daily. 100 each 1   No current facility-administered medications for this visit.    Allergies:   Patient has no known allergies.    Social History:   reports that she has never smoked. She has never used smokeless tobacco. She reports that she does not drink alcohol and does not use drugs.   Family History:  family history includes Breast cancer (age of onset: 26) in her sister.    ROS:     Review of Systems  Constitutional: Negative.   HENT: Negative.    Eyes: Negative.   Respiratory: Negative.    Gastrointestinal: Negative.   Genitourinary: Negative.   Musculoskeletal: Negative.   Skin: Negative.   Neurological: Negative.   Endo/Heme/Allergies:  Negative.   Psychiatric/Behavioral: Negative.    All other systems reviewed and are negative.     All other systems are reviewed and negative.    PHYSICAL EXAM: VS:  BP 111/78   Pulse (!) 55   Ht 5' (1.524 m)   Wt 172 lb 12.8 oz (78.4 kg)   SpO2 98%   BMI 33.75 kg/m  , BMI Body mass index is 33.75 kg/m. Last weight:  Wt Readings from Last 3 Encounters:  01/16/24 172 lb 12.8 oz (78.4 kg)  12/30/23 174 lb (78.9 kg)  12/16/23 178 lb 6.4 oz (80.9 kg)     Physical Exam Constitutional:      Appearance: Normal appearance.  Cardiovascular:     Rate and Rhythm: Normal rate and regular rhythm.     Heart sounds: Normal heart sounds.  Pulmonary:     Effort: Pulmonary effort is normal.     Breath sounds: Normal breath sounds.  Musculoskeletal:     Right lower leg: No edema.     Left lower leg: No edema.  Neurological:     Mental Status: She is alert.       EKG:   Recent Labs: 11/08/2023: ALT 9; BUN 23; Creatinine, Ser 1.52; Hemoglobin 11.0; Platelets 327; Potassium 3.9; Sodium 141 11/15/2023: TSH 0.910    Lipid Panel    Component Value Date/Time   CHOL 161 11/08/2023 1422   TRIG 205 (H) 11/08/2023 1422   HDL 42 11/08/2023 1422   LDLCALC 84 11/08/2023 1422      Other studies Reviewed: Additional studies/ records that were reviewed today include:  Review of the above records demonstrates:       No data to display            ASSESSMENT AND PLAN:    ICD-10-CM   1. CHF (congestive heart failure), NYHA class III, acute on chronic, diastolic (HCC)  I50.33    ONFARXIGA    2. Stage 3b chronic kidney disease (HCC)  N18.32    CREAT 1.53    3. OSA (obstructive sleep apnea)  G47.33     4. Combined hyperlipidemia associated with type 2 diabetes mellitus (HCC)  E11.69    E78.2     5. Body mass index (BMI) of 34.0-34.9 in adult  Z68.34     6. Hypertension associated with diabetes (HCC)  E11.59    I15.2     7. Mixed hyperlipidemia  E78.2     8. SOB  (shortness of breath)  R06.02     9. Nonrheumatic mitral valve regurgitation  I34.0  LVEF 52 PERCENT, WITH MODERATE MR, SEVERE PULMONARY HTN. ON FARXIGA. HAS DOE, NO CHEST PAIN AND HAD NO ISCHAEMIA ON STRESS TEST.       Problem List Items Addressed This Visit       Cardiovascular and Mediastinum   Hypertension associated with diabetes (HCC)   Nonrheumatic mitral valve regurgitation     Respiratory   OSA (obstructive sleep apnea)     Endocrine   Combined hyperlipidemia associated with type 2 diabetes mellitus (HCC)     Genitourinary   Stage 3b chronic kidney disease (HCC)     Other   Mixed hyperlipidemia   Body mass index (BMI) of 34.0-34.9 in adult   Other Visit Diagnoses       CHF (congestive heart failure), NYHA class III, acute on chronic, diastolic (HCC)    -  Primary   ONFARXIGA     SOB (shortness of breath)              Disposition:   Return in about 2 months (around 03/17/2024).    Total time spent: 35 minutes  Signed,  Denyse Bathe, MD  01/16/2024 10:32 AM    [facility]

## 2024-02-07 ENCOUNTER — Other Ambulatory Visit

## 2024-02-07 DIAGNOSIS — E1169 Type 2 diabetes mellitus with other specified complication: Secondary | ICD-10-CM

## 2024-02-07 DIAGNOSIS — E1159 Type 2 diabetes mellitus with other circulatory complications: Secondary | ICD-10-CM

## 2024-02-07 DIAGNOSIS — E1165 Type 2 diabetes mellitus with hyperglycemia: Secondary | ICD-10-CM

## 2024-02-08 LAB — LIPID PANEL W/O CHOL/HDL RATIO
Cholesterol, Total: 128 mg/dL (ref 100–199)
HDL: 44 mg/dL (ref >39)
LDL Chol Calc (NIH): 65 mg/dL (ref 0–99)
Triglycerides: 103 mg/dL (ref 0–149)
VLDL Cholesterol Cal: 19 mg/dL (ref 5–40)

## 2024-02-08 LAB — CBC WITH DIFFERENTIAL/PLATELET
Basophils Absolute: 0.1 x10E3/uL (ref 0.0–0.2)
Basos: 1 %
EOS (ABSOLUTE): 0.2 x10E3/uL (ref 0.0–0.4)
Eos: 3 %
Hematocrit: 34.1 % (ref 34.0–46.6)
Hemoglobin: 10.4 g/dL — ABNORMAL LOW (ref 11.1–15.9)
Immature Grans (Abs): 0 x10E3/uL (ref 0.0–0.1)
Immature Granulocytes: 0 %
Lymphocytes Absolute: 1.3 x10E3/uL (ref 0.7–3.1)
Lymphs: 16 %
MCH: 27.7 pg (ref 26.6–33.0)
MCHC: 30.5 g/dL — ABNORMAL LOW (ref 31.5–35.7)
MCV: 91 fL (ref 79–97)
Monocytes Absolute: 0.7 x10E3/uL (ref 0.1–0.9)
Monocytes: 9 %
Neutrophils Absolute: 5.9 x10E3/uL (ref 1.4–7.0)
Neutrophils: 71 %
Platelets: 275 x10E3/uL (ref 150–450)
RBC: 3.75 x10E6/uL — ABNORMAL LOW (ref 3.77–5.28)
RDW: 13 % (ref 11.7–15.4)
WBC: 8.3 x10E3/uL (ref 3.4–10.8)

## 2024-02-08 LAB — HEMOGLOBIN A1C
Est. average glucose Bld gHb Est-mCnc: 157 mg/dL
Hgb A1c MFr Bld: 7.1 % — ABNORMAL HIGH (ref 4.8–5.6)

## 2024-02-08 LAB — CMP14+EGFR
ALT: 8 IU/L (ref 0–32)
AST: 8 IU/L (ref 0–40)
Albumin: 4.1 g/dL (ref 3.8–4.8)
Alkaline Phosphatase: 109 IU/L (ref 49–135)
BUN/Creatinine Ratio: 14 (ref 12–28)
BUN: 25 mg/dL (ref 8–27)
Bilirubin Total: 0.2 mg/dL (ref 0.0–1.2)
CO2: 25 mmol/L (ref 20–29)
Calcium: 9.5 mg/dL (ref 8.7–10.3)
Chloride: 104 mmol/L (ref 96–106)
Creatinine, Ser: 1.73 mg/dL — ABNORMAL HIGH (ref 0.57–1.00)
Globulin, Total: 2.2 g/dL (ref 1.5–4.5)
Glucose: 129 mg/dL — ABNORMAL HIGH (ref 70–99)
Potassium: 4.4 mmol/L (ref 3.5–5.2)
Sodium: 140 mmol/L (ref 134–144)
Total Protein: 6.3 g/dL (ref 6.0–8.5)
eGFR: 30 mL/min/1.73 — ABNORMAL LOW (ref >59)

## 2024-02-10 ENCOUNTER — Ambulatory Visit (INDEPENDENT_AMBULATORY_CARE_PROVIDER_SITE_OTHER): Admitting: Internal Medicine

## 2024-02-10 ENCOUNTER — Encounter: Payer: Self-pay | Admitting: Internal Medicine

## 2024-02-10 ENCOUNTER — Ambulatory Visit: Payer: Self-pay | Admitting: Internal Medicine

## 2024-02-10 VITALS — BP 138/66 | HR 88 | Ht 60.0 in | Wt 174.6 lb

## 2024-02-10 DIAGNOSIS — E1169 Type 2 diabetes mellitus with other specified complication: Secondary | ICD-10-CM | POA: Diagnosis not present

## 2024-02-10 DIAGNOSIS — E1165 Type 2 diabetes mellitus with hyperglycemia: Secondary | ICD-10-CM

## 2024-02-10 DIAGNOSIS — E782 Mixed hyperlipidemia: Secondary | ICD-10-CM | POA: Diagnosis not present

## 2024-02-10 DIAGNOSIS — Z6834 Body mass index (BMI) 34.0-34.9, adult: Secondary | ICD-10-CM

## 2024-02-10 DIAGNOSIS — I152 Hypertension secondary to endocrine disorders: Secondary | ICD-10-CM

## 2024-02-10 DIAGNOSIS — E1159 Type 2 diabetes mellitus with other circulatory complications: Secondary | ICD-10-CM

## 2024-02-10 DIAGNOSIS — N1832 Chronic kidney disease, stage 3b: Secondary | ICD-10-CM | POA: Diagnosis not present

## 2024-02-10 LAB — POCT CBG (FASTING - GLUCOSE)-MANUAL ENTRY: Glucose Fasting, POC: 149 mg/dL — AB (ref 70–99)

## 2024-02-10 MED ORDER — SEMAGLUTIDE(0.25 OR 0.5MG/DOS) 2 MG/3ML ~~LOC~~ SOPN: 0.5000 mg | PEN_INJECTOR | SUBCUTANEOUS | 1 refills | Status: AC

## 2024-02-10 NOTE — Patient Instructions (Addendum)
 Continue taking 1 glipizide each morning. Continue Hydralazine  100 mg each morning. Increase Ozempic  to 0.5 mg weekly injection. Blood sugar goal 100-120 fasting and max 180 after meals Continue other medications as prescribed. Reinforce diet and drinking more water. Switch diet or zero sugar pepsi= max 2 a day.  Check BP twice a day 30 minutes after taking medication and in the evening. Keep log and notify office if BP is >130/80 or <110/70 and make an appointment if needed  Can take Hydralazine  25 mg in the evening as needed for BP >130/80

## 2024-02-10 NOTE — Progress Notes (Signed)
 Established Patient Office Visit  Subjective:  Patient ID: Cheryl Saunders, female    DOB: October 11, 1945  Age: 78 y.o. MRN: [id number]  Chief Complaint  Patient presents with   Follow-up    6 week follow up    Patient is here today for follow up. She reports feeling well today but does have concerns to discuss.  She reports blood sugars range anywhere from 100-300 on average. She endorses drinking 4 pepsi a day. Will switch to diet and recommend drinking no more that 2 sodas a day. Reinforced healthy diet and stay hydrated by drinking plenty of water each day.  She reports feeling dizzy most mornings after taking all her blood pressure medications. She states this sensation began after we increased her Hydralazine  to 100 mg twice daily. She has only been taking it in the mornings due to how it makes her feel. She takes multiple blood pressure medications. Recommend patient take BP after taking her medications to determine if she is having issues with hypotension and keep a log to bring back with her when she returns. Will reduce Hydralazine  to 50 mg twice a day. Discussed reducing amlodipine  or Lotensin at next appointment if hypotension or dizziness are still a concern.  Otherwise she is doing well and has no additional concerns to discuss. She denies headache, chest pain, shortness of breath or palpitations at this time. Discussed her recent labs in detail today.    No other concerns at this time.   Past Medical History:  Diagnosis Date   Chronic kidney disease (CKD), active medical management without dialysis, stage 3 (moderate) (HCC)    GERD (gastroesophageal reflux disease)    Heart murmur    followed by PCP   Hyperlipidemia associated with type 2 diabetes mellitus (HCC)    Hypertension    Sleep apnea    supposed to use CPAP. Does not.   Type 2 diabetes mellitus with hyperglycemia [facility])     Past Surgical History:  Procedure Laterality Date   ABDOMINAL HYSTERECTOMY     BREAST  BIOPSY Right 11/22/2016    coil clip - PASH   CATARACT EXTRACTION W/ INTRAOCULAR LENS  IMPLANT, BILATERAL  2022   CHOLECYSTECTOMY     COLONOSCOPY WITH PROPOFOL  N/A 10/17/2015   Procedure: COLONOSCOPY WITH PROPOFOL ;  Surgeon: Rogelia Copping, MD;  Location: [facility];  Service: Endoscopy;  Laterality: N/A;  Diabetic - oral meds Sleep apnea   COLONOSCOPY WITH PROPOFOL  N/A 10/03/2020   Procedure: COLONOSCOPY WITH PROPOFOL ;  Surgeon: Copping Rogelia, MD;  Location: [facility];  Service: Endoscopy;  Laterality: N/A;  Diabetic   HERNIA REPAIR     POLYPECTOMY  10/17/2015   Procedure: POLYPECTOMY;  Surgeon: Rogelia Copping, MD;  Location: [facility];  Service: Endoscopy;;  Ascending colon polyps x 2   POLYPECTOMY  10/03/2020   Procedure: POLYPECTOMY;  Surgeon: Copping Rogelia, MD;  Location: [facility];  Service: Endoscopy;;   REPLACEMENT TOTAL KNEE Bilateral     Social History   Socioeconomic History   Marital status: Married    Spouse name: Not on file   Number of children: Not on file   Years of education: Not on file   Highest education level: Not on file  Occupational History   Not on file  Tobacco Use   Smoking status: Never   Smokeless tobacco: Never  Vaping Use   Vaping status: Never Used  Substance and Sexual Activity   Alcohol use: No   Drug  use: No   Sexual activity: Not on file  Other Topics Concern   Not on file  Social History Narrative   Not on file   Social Drivers of Health   Tobacco Use: Low Risk (02/10/2024)   Patient History    Smoking Tobacco Use: Never    Smokeless Tobacco Use: Never    Passive Exposure: Not on file  Financial Resource Strain: Not on file  Food Insecurity: Not on file  Transportation Needs: Not on file  Physical Activity: Not on file  Stress: Not on file  Social Connections: Not on file  Intimate Partner Violence: Not on file  Depression (PHQ2-9): Low Risk (11/08/2023)   Depression (PHQ2-9)    PHQ-2 Score: 1   Alcohol Screen: Not on file  Housing: Not on file  Utilities: Not on file  Health Literacy: Not on file    Family History  Problem Relation Age of Onset   Breast cancer Sister 30    Allergies[1]  Show/hide medication list[2]  Review of Systems  Constitutional: Negative.  Negative for chills, fever and malaise/fatigue.  HENT: Negative.  Negative for congestion and sore throat.   Eyes: Negative.  Negative for blurred vision and pain.  Respiratory: Negative.  Negative for cough and shortness of breath.   Cardiovascular: Negative.  Negative for chest pain, palpitations and leg swelling.  Gastrointestinal: Negative.  Negative for abdominal pain, blood in stool, constipation, diarrhea, heartburn, melena, nausea and vomiting.  Genitourinary: Negative.  Negative for dysuria, flank pain, frequency and urgency.  Musculoskeletal: Negative.  Negative for joint pain and myalgias.  Skin: Negative.   Neurological:  Positive for dizziness (in the mornings after taking all her medications; none at this time). Negative for tingling, sensory change, weakness and headaches.  Endo/Heme/Allergies: Negative.   Psychiatric/Behavioral: Negative.  Negative for depression and suicidal ideas. The patient is not nervous/anxious.        Objective:   BP 138/66   Pulse 88   Ht 5' (1.524 m)   Wt 174 lb 9.6 oz (79.2 kg)   SpO2 98%   BMI 34.10 kg/m   Vitals:   02/10/24 1031  BP: 138/66  Pulse: 88  Height: 5' (1.524 m)  Weight: 174 lb 9.6 oz (79.2 kg)  SpO2: 98%  BMI (Calculated): 34.1    Physical Exam Vitals and nursing note reviewed.  Constitutional:      Appearance: Normal appearance.  HENT:     Head: Normocephalic and atraumatic.     Nose: Nose normal.     Mouth/Throat:     Mouth: Mucous membranes are moist.     Pharynx: Oropharynx is clear.  Eyes:     Conjunctiva/sclera: Conjunctivae normal.     Pupils: Pupils are equal, round, and reactive to light.  Cardiovascular:     Rate and  Rhythm: Normal rate and regular rhythm.     Pulses: Normal pulses.     Heart sounds: Normal heart sounds. No murmur heard. Pulmonary:     Effort: Pulmonary effort is normal.     Breath sounds: Normal breath sounds. No wheezing.  Abdominal:     General: Bowel sounds are normal.     Palpations: Abdomen is soft.     Tenderness: There is no abdominal tenderness. There is no right CVA tenderness or left CVA tenderness.  Musculoskeletal:        General: Normal range of motion.     Cervical back: Normal range of motion.     Right lower leg:  No edema.     Left lower leg: No edema.  Skin:    General: Skin is warm and dry.  Neurological:     General: No focal deficit present.     Mental Status: She is alert and oriented to person, place, and time.  Psychiatric:        Mood and Affect: Mood normal.        Behavior: Behavior normal.      Results for orders placed or performed in visit on 02/10/24  POCT CBG (Fasting - Glucose)  Result Value Ref Range   Glucose Fasting, POC 149 (A) 70 - 99 mg/dL    Recent Results (from the past 2160 hours)  Microalbumin / creatinine urine ratio     Status: None   Collection Time: 11/14/23 12:00 AM  Result Value Ref Range   Microalb Creat Ratio 598.5     Comment: collected at [facility] Nephologist office  T4, free     Status: None   Collection Time: 11/15/23 12:02 PM  Result Value Ref Range   Free T4 1.05 0.61 - 1.12 ng/dL    Comment: (NOTE) Biotin ingestion may interfere with free T4 tests. If the results are inconsistent with the TSH level, previous test results, or the clinical presentation, then consider biotin interference. If needed, order repeat testing after stopping biotin. Performed at [facility], [address] [place], [place] [address]   TSH     Status: None   Collection Time: 11/15/23 12:02 PM  Result Value Ref Range   TSH 0.910 0.350 - 4.500 uIU/mL    Comment: Performed by a 3rd Generation assay with a functional  sensitivity of <=0.01 uIU/mL. Performed at [facility], [address] [place], [place] [address]   T3, free     Status: None   Collection Time: 11/15/23 12:02 PM  Result Value Ref Range   T3, Free 2.9 2.0 - 4.4 pg/mL    Comment: (NOTE) Performed At: [facility] [address] [place], [place] [id number] Jennette Shorter MD Ey:[id number]   Thyroid  stimulating immunoglobulin     Status: None   Collection Time: 11/15/23 12:02 PM  Result Value Ref Range   Thyroid  Stimulating Immunoglob <0.10 0.00 - 0.55 IU/L    Comment: (NOTE) Performed At: [facility] [address] [place], [place] [id number] Jennette Shorter MD Ey:[id number]   POCT CBG (Fasting - Glucose)     Status: Abnormal   Collection Time: 12/02/23 11:14 AM  Result Value Ref Range   Glucose Fasting, POC 280 (A) 70 - 99 mg/dL  POCT CBG (Fasting - Glucose)     Status: Abnormal   Collection Time: 12/16/23 11:32 AM  Result Value Ref Range   Glucose Fasting, POC 250 (A) 70 - 99 mg/dL  POCT CBG (Fasting - Glucose)     Status: Abnormal   Collection Time: 12/30/23 11:09 AM  Result Value Ref Range   Glucose Fasting, POC 105 (A) 70 - 99 mg/dL  Hemoglobin J8r     Status: Abnormal   Collection Time: 02/07/24  9:47 AM  Result Value Ref Range   Hgb A1c MFr Bld 7.1 (H) 4.8 - 5.6 %    Comment:          Prediabetes: 5.7 - 6.4          Diabetes: >6.4          Glycemic control for adults with diabetes: <7.0    Est. average glucose Bld gHb Est-mCnc 157  mg/dL  Lipid Panel w/o Chol/HDL Ratio     Status: None   Collection Time: 02/07/24  9:47 AM  Result Value Ref Range   Cholesterol, Total 128 100 - 199 mg/dL   Triglycerides 896 0 - 149 mg/dL   HDL 44 >60 mg/dL   VLDL Cholesterol Cal 19 5 - 40 mg/dL   LDL Chol Calc (NIH) 65 0 - 99 mg/dL  [id number]     Status: Abnormal   Collection Time: 02/07/24  9:47 AM  Result Value Ref Range   Glucose 129 (H) 70 - 99 mg/dL   BUN 25 8 - 27 mg/dL   Creatinine, Ser 8.26  (H) 0.57 - 1.00 mg/dL   eGFR 30 (L) >40 fO/fpw/8.26   BUN/Creatinine Ratio 14 12 - 28   Sodium 140 134 - 144 mmol/L   Potassium 4.4 3.5 - 5.2 mmol/L   Chloride 104 96 - 106 mmol/L   CO2 25 20 - 29 mmol/L   Calcium  9.5 8.7 - 10.3 mg/dL   Total Protein 6.3 6.0 - 8.5 g/dL   Albumin 4.1 3.8 - 4.8 g/dL   Globulin, Total 2.2 1.5 - 4.5 g/dL   Bilirubin Total 0.2 0.0 - 1.2 mg/dL   Alkaline Phosphatase 109 49 - 135 IU/L   AST 8 0 - 40 IU/L   ALT 8 0 - 32 IU/L  CBC with Differential/Platelet     Status: Abnormal   Collection Time: 02/07/24  9:47 AM  Result Value Ref Range   WBC 8.3 3.4 - 10.8 x10E3/uL   RBC 3.75 (L) 3.77 - 5.28 x10E6/uL   Hemoglobin 10.4 (L) 11.1 - 15.9 g/dL   Hematocrit 65.8 65.9 - 46.6 %   MCV 91 79 - 97 fL   MCH 27.7 26.6 - 33.0 pg   MCHC 30.5 (L) 31.5 - 35.7 g/dL   RDW 86.9 88.2 - 84.5 %   Platelets 275 150 - 450 x10E3/uL   Neutrophils 71 Not Estab. %   Lymphs 16 Not Estab. %   Monocytes 9 Not Estab. %   Eos 3 Not Estab. %   Basos 1 Not Estab. %   Neutrophils Absolute 5.9 1.4 - 7.0 x10E3/uL   Lymphocytes Absolute 1.3 0.7 - 3.1 x10E3/uL   Monocytes Absolute 0.7 0.1 - 0.9 x10E3/uL   EOS (ABSOLUTE) 0.2 0.0 - 0.4 x10E3/uL   Basophils Absolute 0.1 0.0 - 0.2 x10E3/uL   Immature Granulocytes 0 Not Estab. %   Immature Grans (Abs) 0.0 0.0 - 0.1 x10E3/uL  POCT CBG (Fasting - Glucose)     Status: Abnormal   Collection Time: 02/10/24 10:36 AM  Result Value Ref Range   Glucose Fasting, POC 149 (A) 70 - 99 mg/dL      Assessment & Plan:  Reduce Hydralazine  to 50 mg twice day. Start Ozempic  0.5 mg weekly injection. Continue other medications as prescribed. Reinforced healthy diet and staying hydrated by drinking plenty of water each day. Keep BP log and notify office if it remains >130/80 or < 110/60 Patient due for AWV Problem List Items Addressed This Visit       Cardiovascular and Mediastinum   Hypertension associated with diabetes (HCC) - Primary     Endocrine    Diabetes (HCC)   Relevant Orders   POCT CBG (Fasting - Glucose) (Completed)   Combined hyperlipidemia associated with type 2 diabetes mellitus (HCC)     Genitourinary   Stage 3b chronic kidney disease (HCC)     Other   Body  mass index (BMI) of 34.0-34.9 in adult    Return in about 1 month (around 03/12/2024) for medicare AWV.   Total time spent: 25 minutes. This time includes review of previous notes and results and patient face to face interaction during today's visit.    FERNAND FREDY RAMAN, MD  02/10/2024   This document may have been prepared by [facility] Voice Recognition software and as such may include unintentional dictation errors.     [1] No Known Allergies [2]  Outpatient Medications Prior to Visit  Medication Sig   acetaminophen  (TYLENOL ) 500 MG tablet Take 1 tablet (500 mg total) by mouth every 6 (six) hours as needed.   amLODipine  (NORVASC ) 10 MG tablet Take 0.5 tablets (5 mg total) by mouth daily.   aspirin 81 MG tablet Take 81 mg by mouth daily.   benazepril (LOTENSIN) 40 MG tablet Take 40 mg by mouth daily.   chlorthalidone  (HYGROTON ) 25 MG tablet Take 1 tablet (25 mg total) by mouth daily.   dapagliflozin  propanediol (FARXIGA ) 10 MG TABS tablet Take 1 tablet (10 mg total) by mouth daily.   glipiZIDE (GLUCOTROL XL) 10 MG 24 hr tablet Take 10 mg by mouth 2 (two) times daily. (Patient taking differently: Take 10 mg by mouth daily with breakfast.)   hydrALAZINE  (APRESOLINE ) 100 MG tablet Take 1 tablet (100 mg total) by mouth in the morning and at bedtime.   metoprolol succinate (TOPROL-XL) 50 MG 24 hr tablet Take 50 mg by mouth daily. Take with or immediately following a meal.   omeprazole (PRILOSEC) 20 MG capsule Take 20 mg by mouth daily.   rosuvastatin  (CRESTOR ) 20 MG tablet Take 1 tablet (20 mg total) by mouth daily.   Semaglutide ,0.25 or 0.5MG /DOS, 2 MG/3ML SOPN Inject 0.5 mg into the skin once a week.   TRUE METRIX BLOOD GLUCOSE TEST test strip Use as instructed    TRUEplus Lancets 33G MISC Inject 2 each into the skin 2 (two) times daily.   No facility-administered medications prior to visit.

## 2024-02-11 ENCOUNTER — Ambulatory Visit
Admission: RE | Admit: 2024-02-11 | Discharge: 2024-02-11 | Disposition: A | Source: Ambulatory Visit | Attending: Internal Medicine | Admitting: Internal Medicine

## 2024-02-11 DIAGNOSIS — Z1231 Encounter for screening mammogram for malignant neoplasm of breast: Secondary | ICD-10-CM

## 2024-02-25 ENCOUNTER — Ambulatory Visit: Payer: Self-pay | Admitting: Internal Medicine

## 2024-02-25 ENCOUNTER — Ambulatory Visit (INDEPENDENT_AMBULATORY_CARE_PROVIDER_SITE_OTHER): Admitting: Internal Medicine

## 2024-02-25 ENCOUNTER — Encounter: Payer: Self-pay | Admitting: Internal Medicine

## 2024-02-25 VITALS — BP 124/60 | HR 70 | Ht 60.0 in | Wt 172.6 lb

## 2024-02-25 DIAGNOSIS — J301 Allergic rhinitis due to pollen: Secondary | ICD-10-CM | POA: Insufficient documentation

## 2024-02-25 DIAGNOSIS — E1169 Type 2 diabetes mellitus with other specified complication: Secondary | ICD-10-CM

## 2024-02-25 DIAGNOSIS — E1165 Type 2 diabetes mellitus with hyperglycemia: Secondary | ICD-10-CM | POA: Diagnosis not present

## 2024-02-25 DIAGNOSIS — E1159 Type 2 diabetes mellitus with other circulatory complications: Secondary | ICD-10-CM | POA: Diagnosis not present

## 2024-02-25 DIAGNOSIS — E782 Mixed hyperlipidemia: Secondary | ICD-10-CM

## 2024-02-25 DIAGNOSIS — H6123 Impacted cerumen, bilateral: Secondary | ICD-10-CM

## 2024-02-25 DIAGNOSIS — N1832 Chronic kidney disease, stage 3b: Secondary | ICD-10-CM

## 2024-02-25 DIAGNOSIS — I152 Hypertension secondary to endocrine disorders: Secondary | ICD-10-CM

## 2024-02-25 DIAGNOSIS — K219 Gastro-esophageal reflux disease without esophagitis: Secondary | ICD-10-CM | POA: Diagnosis not present

## 2024-02-25 LAB — POCT CBG (FASTING - GLUCOSE)-MANUAL ENTRY: Glucose Fasting, POC: 148 mg/dL — AB (ref 70–99)

## 2024-02-25 MED ORDER — FLUTICASONE PROPIONATE 50 MCG/ACT NA SUSP: 2.0000 | Freq: Every day | NASAL | 2 refills | Status: AC

## 2024-02-25 MED ORDER — OMEPRAZOLE 20 MG PO CPDR: 20.0000 mg | DELAYED_RELEASE_CAPSULE | Freq: Every day | ORAL | 1 refills | Status: AC

## 2024-02-25 MED ORDER — METOPROLOL SUCCINATE ER 50 MG PO TB24: 50.0000 mg | ORAL_TABLET | Freq: Every day | ORAL | 1 refills | Status: AC

## 2024-02-25 MED ORDER — LORATADINE 10 MG PO TABS: 10.0000 mg | ORAL_TABLET | Freq: Every day | ORAL | 2 refills | Status: AC

## 2024-02-25 NOTE — Progress Notes (Signed)
 "  Established Patient Office Visit  Subjective:  Patient ID: Cheryl Saunders, female    DOB: [birth year]  Age: 78 y.o. MRN: [id number]  Chief Complaint  Patient presents with   Follow-up    BP follow up    Patient comes in for her follow-up today.  Her hydralazine  dose had been reduced to 50 mg twice a day.  And she is still on other blood pressure medications.  Patient reports that her readings at home are stable and the lowest she got was 120.  However she continues to have slight lightheadedness and dizziness.  Patient does have a history of allergic rhinitis.  On exam her TMs bilaterally are opaque.  She has large amount of hard brown wax in her left ear as well.  Patient advised to resume her allergy pill, along with Flonase nasal spray.  Debrox eardrops to be used on in both ears and patient will return for her earwax removal in 1 week. Patient will stay on  her current blood pressure regimen for now and continue monitoring blood pressure at home.    No other concerns at this time.   Past Medical History:  Diagnosis Date   Chronic kidney disease (CKD), active medical management without dialysis, stage 3 (moderate) (HCC)    GERD (gastroesophageal reflux disease)    Heart murmur    followed by PCP   Hyperlipidemia associated with type 2 diabetes mellitus (HCC)    Hypertension    Sleep apnea    supposed to use CPAP. Does not.   Type 2 diabetes mellitus with hyperglycemia [facility])     Past Surgical History:  Procedure Laterality Date   ABDOMINAL HYSTERECTOMY     BREAST BIOPSY Right 11/22/2016    coil clip - PASH   CATARACT EXTRACTION W/ INTRAOCULAR LENS  IMPLANT, BILATERAL  2022   CHOLECYSTECTOMY     COLONOSCOPY WITH PROPOFOL  N/A 10/17/2015   Procedure: COLONOSCOPY WITH PROPOFOL ;  Surgeon: Rogelia Copping, MD;  Location: [facility];  Service: Endoscopy;  Laterality: N/A;  Diabetic - oral meds Sleep apnea   COLONOSCOPY WITH PROPOFOL  N/A 10/03/2020   Procedure: COLONOSCOPY WITH  PROPOFOL ;  Surgeon: Copping Rogelia, MD;  Location: [facility];  Service: Endoscopy;  Laterality: N/A;  Diabetic   HERNIA REPAIR     POLYPECTOMY  10/17/2015   Procedure: POLYPECTOMY;  Surgeon: Rogelia Copping, MD;  Location: [facility];  Service: Endoscopy;;  Ascending colon polyps x 2   POLYPECTOMY  10/03/2020   Procedure: POLYPECTOMY;  Surgeon: Copping Rogelia, MD;  Location: [facility];  Service: Endoscopy;;   REPLACEMENT TOTAL KNEE Bilateral     Social History   Socioeconomic History   Marital status: Married    Spouse name: Not on file   Number of children: Not on file   Years of education: Not on file   Highest education level: Not on file  Occupational History   Not on file  Tobacco Use   Smoking status: Never   Smokeless tobacco: Never  Vaping Use   Vaping status: Never Used  Substance and Sexual Activity   Alcohol use: No   Drug use: No   Sexual activity: Not on file  Other Topics Concern   Not on file  Social History Narrative   Not on file   Social Drivers of Health   Tobacco Use: Low Risk (02/25/2024)   Patient History    Smoking Tobacco Use: Never    Smokeless Tobacco Use: Never  Passive Exposure: Not on file  Financial Resource Strain: Not on file  Food Insecurity: Not on file  Transportation Needs: Not on file  Physical Activity: Not on file  Stress: Not on file  Social Connections: Not on file  Intimate Partner Violence: Not on file  Depression (PHQ2-9): Low Risk (11/08/2023)   Depression (PHQ2-9)    PHQ-2 Score: 1  Alcohol Screen: Not on file  Housing: Not on file  Utilities: Not on file  Health Literacy: Not on file    Family History  Problem Relation Age of Onset   Breast cancer Sister 53    Allergies[1]  Show/hide medication list[2]  Review of Systems  Constitutional: Negative.  Negative for chills, fever and malaise/fatigue.  HENT: Negative.  Negative for congestion and sore throat.   Eyes: Negative.  Negative  for blurred vision and pain.  Respiratory: Negative.  Negative for cough and shortness of breath.   Cardiovascular: Negative.  Negative for chest pain, palpitations and leg swelling.  Gastrointestinal: Negative.  Negative for abdominal pain, blood in stool, constipation, diarrhea, heartburn, melena, nausea and vomiting.  Genitourinary: Negative.  Negative for dysuria, flank pain, frequency and urgency.  Musculoskeletal: Negative.  Negative for joint pain and myalgias.  Skin: Negative.   Neurological:  Positive for dizziness. Negative for tingling, sensory change, weakness and headaches.  Endo/Heme/Allergies: Negative.   Psychiatric/Behavioral: Negative.  Negative for depression and suicidal ideas. The patient is not nervous/anxious.        Objective:   BP 124/60   Pulse 70   Ht 5' (1.524 m)   Wt 172 lb 9.6 oz (78.3 kg)   SpO2 98%   BMI 33.71 kg/m   Vitals:   02/25/24 0923  BP: 124/60  Pulse: 70  Height: 5' (1.524 m)  Weight: 172 lb 9.6 oz (78.3 kg)  SpO2: 98%  BMI (Calculated): 33.71    Physical Exam Vitals and nursing note reviewed.  Constitutional:      Appearance: Normal appearance.  HENT:     Head: Normocephalic and atraumatic.     Right Ear: A middle ear effusion is present. There is impacted cerumen.     Left Ear: A middle ear effusion is present. There is impacted cerumen.     Nose: Nose normal.     Mouth/Throat:     Mouth: Mucous membranes are moist.     Pharynx: Oropharynx is clear.  Eyes:     Conjunctiva/sclera: Conjunctivae normal.     Pupils: Pupils are equal, round, and reactive to light.  Cardiovascular:     Rate and Rhythm: Normal rate and regular rhythm.     Pulses: Normal pulses.     Heart sounds: Normal heart sounds. No murmur heard. Pulmonary:     Effort: Pulmonary effort is normal.     Breath sounds: Normal breath sounds. No wheezing.  Abdominal:     General: Bowel sounds are normal.     Palpations: Abdomen is soft.     Tenderness: There  is no abdominal tenderness. There is no right CVA tenderness or left CVA tenderness.  Musculoskeletal:        General: Normal range of motion.     Cervical back: Normal range of motion.     Right lower leg: No edema.     Left lower leg: No edema.  Skin:    General: Skin is warm and dry.  Neurological:     General: No focal deficit present.     Mental Status: She is  alert and oriented to person, place, and time.  Psychiatric:        Mood and Affect: Mood normal.        Behavior: Behavior normal.      Results for orders placed or performed in visit on 02/25/24  POCT CBG (Fasting - Glucose)  Result Value Ref Range   Glucose Fasting, POC 148 (A) 70 - 99 mg/dL    Recent Results (from the past 2160 hours)  POCT CBG (Fasting - Glucose)     Status: Abnormal   Collection Time: 12/02/23 11:14 AM  Result Value Ref Range   Glucose Fasting, POC 280 (A) 70 - 99 mg/dL  POCT CBG (Fasting - Glucose)     Status: Abnormal   Collection Time: 12/16/23 11:32 AM  Result Value Ref Range   Glucose Fasting, POC 250 (A) 70 - 99 mg/dL  POCT CBG (Fasting - Glucose)     Status: Abnormal   Collection Time: 12/30/23 11:09 AM  Result Value Ref Range   Glucose Fasting, POC 105 (A) 70 - 99 mg/dL  Hemoglobin J8r     Status: Abnormal   Collection Time: 02/07/24  9:47 AM  Result Value Ref Range   Hgb A1c MFr Bld 7.1 (H) 4.8 - 5.6 %    Comment:          Prediabetes: 5.7 - 6.4          Diabetes: >6.4          Glycemic control for adults with diabetes: <7.0    Est. average glucose Bld gHb Est-mCnc 157 mg/dL  Lipid Panel w/o Chol/HDL Ratio     Status: None   Collection Time: 02/07/24  9:47 AM  Result Value Ref Range   Cholesterol, Total 128 100 - 199 mg/dL   Triglycerides 896 0 - 149 mg/dL   HDL 44 >60 mg/dL   VLDL Cholesterol Cal 19 5 - 40 mg/dL   LDL Chol Calc (NIH) 65 0 - 99 mg/dL  [id number]     Status: Abnormal   Collection Time: 02/07/24  9:47 AM  Result Value Ref Range   Glucose 129 (H) 70 - 99  mg/dL   BUN 25 8 - 27 mg/dL   Creatinine, Ser 8.26 (H) 0.57 - 1.00 mg/dL   eGFR 30 (L) >40 fO/fpw/8.26   BUN/Creatinine Ratio 14 12 - 28   Sodium 140 134 - 144 mmol/L   Potassium 4.4 3.5 - 5.2 mmol/L   Chloride 104 96 - 106 mmol/L   CO2 25 20 - 29 mmol/L   Calcium  9.5 8.7 - 10.3 mg/dL   Total Protein 6.3 6.0 - 8.5 g/dL   Albumin 4.1 3.8 - 4.8 g/dL   Globulin, Total 2.2 1.5 - 4.5 g/dL   Bilirubin Total 0.2 0.0 - 1.2 mg/dL   Alkaline Phosphatase 109 49 - 135 IU/L   AST 8 0 - 40 IU/L   ALT 8 0 - 32 IU/L  CBC with Differential/Platelet     Status: Abnormal   Collection Time: 02/07/24  9:47 AM  Result Value Ref Range   WBC 8.3 3.4 - 10.8 x10E3/uL   RBC 3.75 (L) 3.77 - 5.28 x10E6/uL   Hemoglobin 10.4 (L) 11.1 - 15.9 g/dL   Hematocrit 65.8 65.9 - 46.6 %   MCV 91 79 - 97 fL   MCH 27.7 26.6 - 33.0 pg   MCHC 30.5 (L) 31.5 - 35.7 g/dL   RDW 86.9 88.2 - 84.5 %   Platelets 275  150 - 450 x10E3/uL   Neutrophils 71 Not Estab. %   Lymphs 16 Not Estab. %   Monocytes 9 Not Estab. %   Eos 3 Not Estab. %   Basos 1 Not Estab. %   Neutrophils Absolute 5.9 1.4 - 7.0 x10E3/uL   Lymphocytes Absolute 1.3 0.7 - 3.1 x10E3/uL   Monocytes Absolute 0.7 0.1 - 0.9 x10E3/uL   EOS (ABSOLUTE) 0.2 0.0 - 0.4 x10E3/uL   Basophils Absolute 0.1 0.0 - 0.2 x10E3/uL   Immature Granulocytes 0 Not Estab. %   Immature Grans (Abs) 0.0 0.0 - 0.1 x10E3/uL  POCT CBG (Fasting - Glucose)     Status: Abnormal   Collection Time: 02/10/24 10:36 AM  Result Value Ref Range   Glucose Fasting, POC 149 (A) 70 - 99 mg/dL  POCT CBG (Fasting - Glucose)     Status: Abnormal   Collection Time: 02/25/24  9:28 AM  Result Value Ref Range   Glucose Fasting, POC 148 (A) 70 - 99 mg/dL      Assessment & Plan:  Continue current medications.  Add Claritin and Flonase nasal spray.  Debrox eardrops.  Wax removal in 1 week.   Monitor blood pressure at home.  Return sooner if needed. Problem List Items Addressed This Visit        Cardiovascular and Mediastinum   Hypertension associated with diabetes (HCC)   Relevant Medications   metoprolol succinate (TOPROL-XL) 50 MG 24 hr tablet     Respiratory   Seasonal allergic rhinitis due to pollen   Relevant Medications   fluticasone (FLONASE) 50 MCG/ACT nasal spray   loratadine (CLARITIN) 10 MG tablet     Digestive   Gastroesophageal reflux disease without esophagitis   Relevant Medications   omeprazole (PRILOSEC) 20 MG capsule     Endocrine   Diabetes (HCC) - Primary   Relevant Orders   POCT CBG (Fasting - Glucose) (Completed)   Combined hyperlipidemia associated with type 2 diabetes mellitus (HCC)   Relevant Medications   metoprolol succinate (TOPROL-XL) 50 MG 24 hr tablet     Genitourinary   Stage 3b chronic kidney disease (HCC)     Other   Mixed hyperlipidemia   Relevant Medications   metoprolol succinate (TOPROL-XL) 50 MG 24 hr tablet   Other Visit Diagnoses       Bilateral impacted cerumen           Return in about 6 weeks (around 04/07/2024).   Total time spent: 30 minutes. This time includes review of previous notes and results and patient face to face interaction during today's visit.    FERNAND FREDY RAMAN, MD  02/25/2024   This document may have been prepared by [facility] Voice Recognition software and as such may include unintentional dictation errors.     [1] No Known Allergies [2]  Outpatient Medications Prior to Visit  Medication Sig   acetaminophen  (TYLENOL ) 500 MG tablet Take 1 tablet (500 mg total) by mouth every 6 (six) hours as needed.   amLODipine  (NORVASC ) 10 MG tablet Take 0.5 tablets (5 mg total) by mouth daily.   aspirin 81 MG tablet Take 81 mg by mouth daily.   benazepril (LOTENSIN) 40 MG tablet Take 40 mg by mouth daily.   chlorthalidone  (HYGROTON ) 25 MG tablet Take 1 tablet (25 mg total) by mouth daily.   dapagliflozin  propanediol (FARXIGA ) 10 MG TABS tablet Take 1 tablet (10 mg total) by mouth daily.   glipiZIDE  (GLUCOTROL XL) 10 MG 24 hr tablet Take  10 mg by mouth 2 (two) times daily. (Patient taking differently: Take 10 mg by mouth daily with breakfast.)   hydrALAZINE  (APRESOLINE ) 100 MG tablet Take 1 tablet (100 mg total) by mouth in the morning and at bedtime.   rosuvastatin  (CRESTOR ) 20 MG tablet Take 1 tablet (20 mg total) by mouth daily.   Semaglutide ,0.25 or 0.5MG /DOS, 2 MG/3ML SOPN Inject 0.5 mg into the skin once a week.   TRUE METRIX BLOOD GLUCOSE TEST test strip Use as instructed   TRUEplus Lancets 33G MISC Inject 2 each into the skin 2 (two) times daily.   [DISCONTINUED] metoprolol succinate (TOPROL-XL) 50 MG 24 hr tablet Take 50 mg by mouth daily. Take with or immediately following a meal.   [DISCONTINUED] omeprazole (PRILOSEC) 20 MG capsule Take 20 mg by mouth daily.   No facility-administered medications prior to visit.   "

## 2024-02-26 NOTE — Progress Notes (Signed)
 CAIA LOFARO                                          MRN: [id number]   02/26/2024   The [facility] Quality Team Specialist reviewed this patient medical record for the purposes of chart review for care gap closure. The following were reviewed: abstraction for care gap closure-controlling blood pressure.    [facility] Quality Team

## 2024-03-03 ENCOUNTER — Ambulatory Visit (INDEPENDENT_AMBULATORY_CARE_PROVIDER_SITE_OTHER): Admitting: Internal Medicine

## 2024-03-03 ENCOUNTER — Encounter: Payer: Self-pay | Admitting: Internal Medicine

## 2024-03-03 DIAGNOSIS — H6123 Impacted cerumen, bilateral: Secondary | ICD-10-CM

## 2024-03-03 NOTE — Progress Notes (Signed)
 Patient was seen today for nurse visit and had bilateral ear wash performed and cerumen impaction was removed at that time.

## 2024-03-16 ENCOUNTER — Ambulatory Visit: Admitting: Internal Medicine

## 2024-03-16 ENCOUNTER — Encounter: Payer: Self-pay | Admitting: Internal Medicine

## 2024-03-16 VITALS — BP 132/68 | HR 65 | Ht 60.0 in | Wt 173.8 lb

## 2024-03-16 DIAGNOSIS — G4733 Obstructive sleep apnea (adult) (pediatric): Secondary | ICD-10-CM | POA: Diagnosis not present

## 2024-03-16 DIAGNOSIS — K219 Gastro-esophageal reflux disease without esophagitis: Secondary | ICD-10-CM | POA: Diagnosis not present

## 2024-03-16 DIAGNOSIS — I152 Hypertension secondary to endocrine disorders: Secondary | ICD-10-CM | POA: Diagnosis not present

## 2024-03-16 DIAGNOSIS — N1832 Chronic kidney disease, stage 3b: Secondary | ICD-10-CM | POA: Diagnosis not present

## 2024-03-16 DIAGNOSIS — E1159 Type 2 diabetes mellitus with other circulatory complications: Secondary | ICD-10-CM | POA: Diagnosis not present

## 2024-03-16 DIAGNOSIS — Z0001 Encounter for general adult medical examination with abnormal findings: Secondary | ICD-10-CM | POA: Diagnosis not present

## 2024-03-16 DIAGNOSIS — E1165 Type 2 diabetes mellitus with hyperglycemia: Secondary | ICD-10-CM | POA: Diagnosis not present

## 2024-03-16 DIAGNOSIS — E66811 Obesity, class 1: Secondary | ICD-10-CM

## 2024-03-16 DIAGNOSIS — E1169 Type 2 diabetes mellitus with other specified complication: Secondary | ICD-10-CM | POA: Diagnosis not present

## 2024-03-16 DIAGNOSIS — E782 Mixed hyperlipidemia: Secondary | ICD-10-CM | POA: Diagnosis not present

## 2024-03-16 DIAGNOSIS — Z6833 Body mass index (BMI) 33.0-33.9, adult: Secondary | ICD-10-CM

## 2024-03-16 DIAGNOSIS — E6609 Other obesity due to excess calories: Secondary | ICD-10-CM | POA: Diagnosis not present

## 2024-03-16 DIAGNOSIS — Z0000 Encounter for general adult medical examination without abnormal findings: Secondary | ICD-10-CM | POA: Insufficient documentation

## 2024-03-16 LAB — POCT CBG (FASTING - GLUCOSE)-MANUAL ENTRY: Glucose Fasting, POC: 155 mg/dL — AB (ref 70–99)

## 2024-03-16 NOTE — Progress Notes (Signed)
 "  Established Patient Office Visit  Subjective:  Patient ID: Cheryl Saunders, female    DOB: [birth year]  Age: 79 y.o. MRN: [id number]  Chief Complaint  Patient presents with   Annual Exam    AWV    Patient is here today for Medicare AWV. She states she has been well today and has no concerns to discuss at this time.  She reports she has increased her Ozempic  to 0.5 mg weekly injection. She reports not having any side effects. She reports taking her other medications as prescribed. Reinforced healthy diet and exercise as tolerated.  Patient is not due for routine blood work at this time as it was completed 01/2024.  Mammogram last completed 12/25. Last Dexa scan 03/2022. Will order to be updated later this year. Last colonoscopy was 09/2020.  PHQ-9 score 0; GAD-7 score 0; 6CIT score 0. Reviewed SDOH.    No other concerns at this time.   Past Medical History:  Diagnosis Date   Chronic kidney disease (CKD), active medical management without dialysis, stage 3 (moderate) (HCC)    GERD (gastroesophageal reflux disease)    Heart murmur    followed by PCP   Hyperlipidemia associated with type 2 diabetes mellitus (HCC)    Hypertension    Sleep apnea    supposed to use CPAP. Does not.   Type 2 diabetes mellitus with hyperglycemia [facility])     Past Surgical History:  Procedure Laterality Date   ABDOMINAL HYSTERECTOMY     BREAST BIOPSY Right 11/22/2016    coil clip - PASH   CATARACT EXTRACTION W/ INTRAOCULAR LENS  IMPLANT, BILATERAL  2022   CHOLECYSTECTOMY     COLONOSCOPY WITH PROPOFOL  N/A 10/17/2015   Procedure: COLONOSCOPY WITH PROPOFOL ;  Surgeon: Rogelia Copping, MD;  Location: [facility];  Service: Endoscopy;  Laterality: N/A;  Diabetic - oral meds Sleep apnea   COLONOSCOPY WITH PROPOFOL  N/A 10/03/2020   Procedure: COLONOSCOPY WITH PROPOFOL ;  Surgeon: Copping Rogelia, MD;  Location: [facility];  Service: Endoscopy;  Laterality: N/A;  Diabetic   HERNIA REPAIR     POLYPECTOMY   10/17/2015   Procedure: POLYPECTOMY;  Surgeon: Rogelia Copping, MD;  Location: [facility];  Service: Endoscopy;;  Ascending colon polyps x 2   POLYPECTOMY  10/03/2020   Procedure: POLYPECTOMY;  Surgeon: Copping Rogelia, MD;  Location: [facility];  Service: Endoscopy;;   REPLACEMENT TOTAL KNEE Bilateral     Social History   Socioeconomic History   Marital status: Married    Spouse name: Not on file   Number of children: Not on file   Years of education: Not on file   Highest education level: Not on file  Occupational History   Not on file  Tobacco Use   Smoking status: Never   Smokeless tobacco: Never  Vaping Use   Vaping status: Never Used  Substance and Sexual Activity   Alcohol use: No   Drug use: No   Sexual activity: Not on file  Other Topics Concern   Not on file  Social History Narrative   Not on file   Social Drivers of Health   Tobacco Use: Low Risk (03/16/2024)   Patient History    Smoking Tobacco Use: Never    Smokeless Tobacco Use: Never    Passive Exposure: Not on file  Financial Resource Strain: Not on file  Food Insecurity: Not on file  Transportation Needs: Not on file  Physical Activity: Not on file  Stress: Not  on file  Social Connections: Not on file  Intimate Partner Violence: Not on file  Depression [phone]): Low Risk (03/16/2024)   Depression (PHQ2-9)    PHQ-2 Score: 0  Alcohol Screen: Not on file  Housing: Not on file  Utilities: Not on file  Health Literacy: Not on file    Family History  Problem Relation Age of Onset   Breast cancer Sister 39    Allergies[1]  Show/hide medication list[2]  Review of Systems  Constitutional: Negative.  Negative for chills, fever and malaise/fatigue.  HENT: Negative.  Negative for congestion and sore throat.   Eyes: Negative.  Negative for blurred vision and pain.  Respiratory: Negative.  Negative for cough and shortness of breath.   Cardiovascular: Negative.  Negative for chest pain,  palpitations and leg swelling.  Gastrointestinal: Negative.  Negative for abdominal pain, blood in stool, constipation, diarrhea, heartburn, melena, nausea and vomiting.  Genitourinary: Negative.  Negative for dysuria, flank pain, frequency and urgency.  Musculoskeletal: Negative.  Negative for joint pain and myalgias.  Skin: Negative.   Neurological: Negative.  Negative for dizziness, tingling, sensory change, weakness and headaches.  Endo/Heme/Allergies: Negative.   Psychiatric/Behavioral: Negative.  Negative for depression and suicidal ideas. The patient is not nervous/anxious.        Objective:   BP 132/68   Pulse 65   Ht 5' (1.524 m)   Wt 173 lb 12.8 oz (78.8 kg)   SpO2 95%   BMI 33.94 kg/m   Vitals:   03/16/24 1108  BP: 132/68  Pulse: 65  Height: 5' (1.524 m)  Weight: 173 lb 12.8 oz (78.8 kg)  SpO2: 95%  BMI (Calculated): 33.94    Physical Exam Vitals and nursing note reviewed.  Constitutional:      Appearance: Normal appearance.  HENT:     Head: Normocephalic and atraumatic.     Nose: Nose normal.     Mouth/Throat:     Mouth: Mucous membranes are moist.     Pharynx: Oropharynx is clear.  Eyes:     Conjunctiva/sclera: Conjunctivae normal.     Pupils: Pupils are equal, round, and reactive to light.  Cardiovascular:     Rate and Rhythm: Normal rate and regular rhythm.     Pulses: Normal pulses.     Heart sounds: Normal heart sounds. No murmur heard. Pulmonary:     Effort: Pulmonary effort is normal.     Breath sounds: Normal breath sounds. No wheezing.  Abdominal:     General: Bowel sounds are normal.     Palpations: Abdomen is soft.     Tenderness: There is no abdominal tenderness. There is no right CVA tenderness or left CVA tenderness.  Musculoskeletal:        General: Normal range of motion.     Cervical back: Normal range of motion.     Right lower leg: No edema.     Left lower leg: No edema.  Skin:    General: Skin is warm and dry.   Neurological:     General: No focal deficit present.     Mental Status: She is alert and oriented to person, place, and time.  Psychiatric:        Mood and Affect: Mood normal.        Behavior: Behavior normal.      Results for orders placed or performed in visit on 03/16/24  POCT CBG (Fasting - Glucose)  Result Value Ref Range   Glucose Fasting, POC 155 (A) 70 -  99 mg/dL    Recent Results (from the past 2160 hours)  POCT CBG (Fasting - Glucose)     Status: Abnormal   Collection Time: 12/30/23 11:09 AM  Result Value Ref Range   Glucose Fasting, POC 105 (A) 70 - 99 mg/dL  Hemoglobin J8r     Status: Abnormal   Collection Time: 02/07/24  9:47 AM  Result Value Ref Range   Hgb A1c MFr Bld 7.1 (H) 4.8 - 5.6 %    Comment:          Prediabetes: 5.7 - 6.4          Diabetes: >6.4          Glycemic control for adults with diabetes: <7.0    Est. average glucose Bld gHb Est-mCnc 157 mg/dL  Lipid Panel w/o Chol/HDL Ratio     Status: None   Collection Time: 02/07/24  9:47 AM  Result Value Ref Range   Cholesterol, Total 128 100 - 199 mg/dL   Triglycerides 896 0 - 149 mg/dL   HDL 44 >60 mg/dL   VLDL Cholesterol Cal 19 5 - 40 mg/dL   LDL Chol Calc (NIH) 65 0 - 99 mg/dL  [id number]     Status: Abnormal   Collection Time: 02/07/24  9:47 AM  Result Value Ref Range   Glucose 129 (H) 70 - 99 mg/dL   BUN 25 8 - 27 mg/dL   Creatinine, Ser 8.26 (H) 0.57 - 1.00 mg/dL   eGFR 30 (L) >40 fO/fpw/8.26   BUN/Creatinine Ratio 14 12 - 28   Sodium 140 134 - 144 mmol/L   Potassium 4.4 3.5 - 5.2 mmol/L   Chloride 104 96 - 106 mmol/L   CO2 25 20 - 29 mmol/L   Calcium  9.5 8.7 - 10.3 mg/dL   Total Protein 6.3 6.0 - 8.5 g/dL   Albumin 4.1 3.8 - 4.8 g/dL   Globulin, Total 2.2 1.5 - 4.5 g/dL   Bilirubin Total 0.2 0.0 - 1.2 mg/dL   Alkaline Phosphatase 109 49 - 135 IU/L   AST 8 0 - 40 IU/L   ALT 8 0 - 32 IU/L  CBC with Differential/Platelet     Status: Abnormal   Collection Time: 02/07/24  9:47 AM   Result Value Ref Range   WBC 8.3 3.4 - 10.8 x10E3/uL   RBC 3.75 (L) 3.77 - 5.28 x10E6/uL   Hemoglobin 10.4 (L) 11.1 - 15.9 g/dL   Hematocrit 65.8 65.9 - 46.6 %   MCV 91 79 - 97 fL   MCH 27.7 26.6 - 33.0 pg   MCHC 30.5 (L) 31.5 - 35.7 g/dL   RDW 86.9 88.2 - 84.5 %   Platelets 275 150 - 450 x10E3/uL   Neutrophils 71 Not Estab. %   Lymphs 16 Not Estab. %   Monocytes 9 Not Estab. %   Eos 3 Not Estab. %   Basos 1 Not Estab. %   Neutrophils Absolute 5.9 1.4 - 7.0 x10E3/uL   Lymphocytes Absolute 1.3 0.7 - 3.1 x10E3/uL   Monocytes Absolute 0.7 0.1 - 0.9 x10E3/uL   EOS (ABSOLUTE) 0.2 0.0 - 0.4 x10E3/uL   Basophils Absolute 0.1 0.0 - 0.2 x10E3/uL   Immature Granulocytes 0 Not Estab. %   Immature Grans (Abs) 0.0 0.0 - 0.1 x10E3/uL  POCT CBG (Fasting - Glucose)     Status: Abnormal   Collection Time: 02/10/24 10:36 AM  Result Value Ref Range   Glucose Fasting, POC 149 (A) 70 - 99 mg/dL  POCT CBG (Fasting - Glucose)     Status: Abnormal   Collection Time: 02/25/24  9:28 AM  Result Value Ref Range   Glucose Fasting, POC 148 (A) 70 - 99 mg/dL  POCT CBG (Fasting - Glucose)     Status: Abnormal   Collection Time: 03/16/24 11:12 AM  Result Value Ref Range   Glucose Fasting, POC 155 (A) 70 - 99 mg/dL      Assessment & Plan:  Continue taking medications as prescribed. Reinforced healthy diet and exercise as tolerated. Reinforced staying hydrated and drinking plenty of water  daily. Keep specialist appointments as scheduled. Problem List Items Addressed This Visit       Cardiovascular and Mediastinum   Hypertension associated with diabetes (HCC)     Respiratory   OSA (obstructive sleep apnea)     Digestive   Gastroesophageal reflux disease without esophagitis     Endocrine   Diabetes (HCC)   Relevant Orders   POCT CBG (Fasting - Glucose) (Completed)   Combined hyperlipidemia associated with type 2 diabetes mellitus (HCC)     Genitourinary   Stage 3b chronic kidney disease (HCC)      Other   Obesity   Medicare annual wellness visit, subsequent - Primary    Return in about 3 months (around 06/14/2024).   Total time spent: 30 minutes. This time includes review of previous notes and results and patient face to face interaction during today's visit.    FERNAND FREDY RAMAN, MD  03/16/2024   This document may have been prepared by [facility] Voice Recognition software and as such may include unintentional dictation errors.     [1] No Known Allergies [2]  Outpatient Medications Prior to Visit  Medication Sig   acetaminophen  (TYLENOL ) 500 MG tablet Take 1 tablet (500 mg total) by mouth every 6 (six) hours as needed.   amLODipine  (NORVASC ) 10 MG tablet Take 0.5 tablets (5 mg total) by mouth daily.   aspirin 81 MG tablet Take 81 mg by mouth daily.   benazepril (LOTENSIN) 40 MG tablet Take 40 mg by mouth daily.   chlorthalidone  (HYGROTON ) 25 MG tablet Take 1 tablet (25 mg total) by mouth daily.   dapagliflozin  propanediol (FARXIGA ) 10 MG TABS tablet Take 1 tablet (10 mg total) by mouth daily.   fluticasone  (FLONASE ) 50 MCG/ACT nasal spray Place 2 sprays into both nostrils daily.   glipiZIDE (GLUCOTROL XL) 10 MG 24 hr tablet Take 10 mg by mouth 2 (two) times daily. (Patient taking differently: Take 10 mg by mouth daily with breakfast.)   hydrALAZINE  (APRESOLINE ) 100 MG tablet Take 1 tablet (100 mg total) by mouth in the morning and at bedtime.   loratadine  (CLARITIN ) 10 MG tablet Take 1 tablet (10 mg total) by mouth daily.   metoprolol  succinate (TOPROL -XL) 50 MG 24 hr tablet Take 1 tablet (50 mg total) by mouth daily. Take with or immediately following a meal.   omeprazole  (PRILOSEC) 20 MG capsule Take 1 capsule (20 mg total) by mouth daily.   rosuvastatin  (CRESTOR ) 20 MG tablet Take 1 tablet (20 mg total) by mouth daily.   Semaglutide ,0.25 or 0.5MG /DOS, 2 MG/3ML SOPN Inject 0.5 mg into the skin once a week.   TRUE METRIX BLOOD GLUCOSE TEST test strip Use as instructed    TRUEplus Lancets 33G MISC Inject 2 each into the skin 2 (two) times daily.   No facility-administered medications prior to visit.   "

## 2024-03-17 ENCOUNTER — Ambulatory Visit: Admitting: Cardiovascular Disease

## 2024-03-17 ENCOUNTER — Encounter: Payer: Self-pay | Admitting: Cardiovascular Disease

## 2024-03-17 VITALS — BP 128/70 | HR 62 | Ht 60.0 in | Wt 174.0 lb

## 2024-03-17 DIAGNOSIS — G4733 Obstructive sleep apnea (adult) (pediatric): Secondary | ICD-10-CM | POA: Diagnosis not present

## 2024-03-17 DIAGNOSIS — E782 Mixed hyperlipidemia: Secondary | ICD-10-CM

## 2024-03-17 DIAGNOSIS — Z6834 Body mass index (BMI) 34.0-34.9, adult: Secondary | ICD-10-CM | POA: Diagnosis not present

## 2024-03-17 DIAGNOSIS — I5033 Acute on chronic diastolic (congestive) heart failure: Secondary | ICD-10-CM

## 2024-03-17 DIAGNOSIS — K219 Gastro-esophageal reflux disease without esophagitis: Secondary | ICD-10-CM

## 2024-03-17 DIAGNOSIS — R0602 Shortness of breath: Secondary | ICD-10-CM

## 2024-03-17 DIAGNOSIS — I152 Hypertension secondary to endocrine disorders: Secondary | ICD-10-CM

## 2024-03-17 DIAGNOSIS — E1159 Type 2 diabetes mellitus with other circulatory complications: Secondary | ICD-10-CM | POA: Diagnosis not present

## 2024-03-17 DIAGNOSIS — E1169 Type 2 diabetes mellitus with other specified complication: Secondary | ICD-10-CM | POA: Diagnosis not present

## 2024-03-17 DIAGNOSIS — I340 Nonrheumatic mitral (valve) insufficiency: Secondary | ICD-10-CM | POA: Diagnosis not present

## 2024-03-17 DIAGNOSIS — N1832 Chronic kidney disease, stage 3b: Secondary | ICD-10-CM

## 2024-03-17 NOTE — Progress Notes (Signed)
 "     Cardiology Office Note   Date:  03/17/2024   ID:  Cheryl Saunders, Cheryl Saunders 08/15/1945, MRN [id number]  PCP:  Fernand Fredy RAMAN, MD  Cardiologist:  Denyse Fernand, MD      History of Present Illness: Cheryl Saunders is a 79 y.o. female who presents for  Chief Complaint  Patient presents with   Follow-up    2 month follow up    Doing well.      Past Medical History:  Diagnosis Date   Chronic kidney disease (CKD), active medical management without dialysis, stage 3 (moderate) (HCC)    GERD (gastroesophageal reflux disease)    Heart murmur    followed by PCP   Hyperlipidemia associated with type 2 diabetes mellitus (HCC)    Hypertension    Sleep apnea    supposed to use CPAP. Does not.   Type 2 diabetes mellitus with hyperglycemia [facility])      Past Surgical History:  Procedure Laterality Date   ABDOMINAL HYSTERECTOMY     BREAST BIOPSY Right 11/22/2016    coil clip - PASH   CATARACT EXTRACTION W/ INTRAOCULAR LENS  IMPLANT, BILATERAL  2022   CHOLECYSTECTOMY     COLONOSCOPY WITH PROPOFOL  N/A 10/17/2015   Procedure: COLONOSCOPY WITH PROPOFOL ;  Surgeon: Rogelia Copping, MD;  Location: [facility];  Service: Endoscopy;  Laterality: N/A;  Diabetic - oral meds Sleep apnea   COLONOSCOPY WITH PROPOFOL  N/A 10/03/2020   Procedure: COLONOSCOPY WITH PROPOFOL ;  Surgeon: Copping Rogelia, MD;  Location: [facility];  Service: Endoscopy;  Laterality: N/A;  Diabetic   HERNIA REPAIR     POLYPECTOMY  10/17/2015   Procedure: POLYPECTOMY;  Surgeon: Rogelia Copping, MD;  Location: [facility];  Service: Endoscopy;;  Ascending colon polyps x 2   POLYPECTOMY  10/03/2020   Procedure: POLYPECTOMY;  Surgeon: Copping Rogelia, MD;  Location: [facility];  Service: Endoscopy;;   REPLACEMENT TOTAL KNEE Bilateral      Current Outpatient Medications  Medication Sig Dispense Refill   acetaminophen  (TYLENOL ) 500 MG tablet Take 1 tablet (500 mg total) by mouth every 6 (six) hours as needed. 30  tablet 0   amLODipine  (NORVASC ) 10 MG tablet Take 0.5 tablets (5 mg total) by mouth daily. 90 tablet 1   aspirin 81 MG tablet Take 81 mg by mouth daily.     benazepril (LOTENSIN) 40 MG tablet Take 40 mg by mouth daily.     chlorthalidone  (HYGROTON ) 25 MG tablet Take 1 tablet (25 mg total) by mouth daily. 90 tablet 1   dapagliflozin  propanediol (FARXIGA ) 10 MG TABS tablet Take 1 tablet (10 mg total) by mouth daily. 90 tablet 3   fluticasone  (FLONASE ) 50 MCG/ACT nasal spray Place 2 sprays into both nostrils daily. 16 g 2   glipiZIDE (GLUCOTROL XL) 10 MG 24 hr tablet Take 10 mg by mouth 2 (two) times daily. (Patient taking differently: Take 10 mg by mouth daily with breakfast.)     hydrALAZINE  (APRESOLINE ) 100 MG tablet Take 1 tablet (100 mg total) by mouth in the morning and at bedtime. 90 tablet 3   loratadine  (CLARITIN ) 10 MG tablet Take 1 tablet (10 mg total) by mouth daily. 30 tablet 2   metoprolol  succinate (TOPROL -XL) 50 MG 24 hr tablet Take 1 tablet (50 mg total) by mouth daily. Take with or immediately following a meal. 90 tablet 1   omeprazole  (PRILOSEC) 20 MG capsule Take 1 capsule (20 mg total) by mouth  daily. 90 capsule 1   rosuvastatin  (CRESTOR ) 20 MG tablet Take 1 tablet (20 mg total) by mouth daily. 90 tablet 3   Semaglutide ,0.25 or 0.5MG /DOS, 2 MG/3ML SOPN Inject 0.5 mg into the skin once a week. 3 mL 1   TRUE METRIX BLOOD GLUCOSE TEST test strip Use as instructed 100 each 1   TRUEplus Lancets 33G MISC Inject 2 each into the skin 2 (two) times daily. 100 each 1   No current facility-administered medications for this visit.    Allergies:   Patient has no known allergies.    Social History:   reports that she has never smoked. She has never used smokeless tobacco. She reports that she does not drink alcohol and does not use drugs.   Family History:  family history includes Breast cancer (age of onset: 7) in her sister.    ROS:     Review of Systems  Constitutional:  Negative.   HENT: Negative.    Eyes: Negative.   Respiratory: Negative.    Gastrointestinal: Negative.   Genitourinary: Negative.   Musculoskeletal: Negative.   Skin: Negative.   Neurological: Negative.   Endo/Heme/Allergies: Negative.   Psychiatric/Behavioral: Negative.    All other systems reviewed and are negative.     All other systems are reviewed and negative.    PHYSICAL EXAM: VS:  BP 128/70   Pulse 62   Ht 5' (1.524 m)   Wt 174 lb (78.9 kg)   SpO2 97%   BMI 33.98 kg/m  , BMI Body mass index is 33.98 kg/m. Last weight:  Wt Readings from Last 3 Encounters:  03/17/24 174 lb (78.9 kg)  03/16/24 173 lb 12.8 oz (78.8 kg)  02/25/24 172 lb 9.6 oz (78.3 kg)     Physical Exam Constitutional:      Appearance: Normal appearance.  Cardiovascular:     Rate and Rhythm: Normal rate and regular rhythm.     Heart sounds: Normal heart sounds.  Pulmonary:     Effort: Pulmonary effort is normal.     Breath sounds: Normal breath sounds.  Musculoskeletal:     Right lower leg: No edema.     Left lower leg: No edema.  Neurological:     Mental Status: She is alert.       EKG:   Recent Labs: 11/15/2023: TSH 0.910 02/07/2024: ALT 8; BUN 25; Creatinine, Ser 1.73; Hemoglobin 10.4; Platelets 275; Potassium 4.4; Sodium 140    Lipid Panel    Component Value Date/Time   CHOL 128 02/07/2024 0947   TRIG 103 02/07/2024 0947   HDL 44 02/07/2024 0947   LDLCALC 65 02/07/2024 0947      Other studies Reviewed: Additional studies/ records that were reviewed today include:  Review of the above records demonstrates:       No data to display            ASSESSMENT AND PLAN:    ICD-10-CM   1. CHF (congestive heart failure), NYHA class III, acute on chronic, diastolic (HCC)  I50.33    compensated on GDMT    2. Stage 3b chronic kidney disease (HCC)  N18.32    creat 1.73, GFR 30    3. Combined hyperlipidemia associated with type 2 diabetes mellitus (HCC)  E11.69     E78.2     4. Hypertension associated with diabetes (HCC)  E11.59    I15.2     5. OSA (obstructive sleep apnea)  G47.33     6. Gastroesophageal reflux  disease without esophagitis  K21.9     7. Mixed hyperlipidemia  E78.2     8. Body mass index (BMI) of 34.0-34.9 in adult  Z68.34     9. SOB (shortness of breath)  R06.02     10. Nonrheumatic mitral valve regurgitation  I34.0        Problem List Items Addressed This Visit       Cardiovascular and Mediastinum   Hypertension associated with diabetes (HCC)   Nonrheumatic mitral valve regurgitation     Respiratory   OSA (obstructive sleep apnea)     Digestive   Gastroesophageal reflux disease without esophagitis     Endocrine   Combined hyperlipidemia associated with type 2 diabetes mellitus (HCC)     Genitourinary   Stage 3b chronic kidney disease (HCC)     Other   Mixed hyperlipidemia   Body mass index (BMI) of 34.0-34.9 in adult   Other Visit Diagnoses       CHF (congestive heart failure), NYHA class III, acute on chronic, diastolic (HCC)    -  Primary   compensated on GDMT     SOB (shortness of breath)              Disposition:   Return in about 3 months (around 06/15/2024).    Total time spent: 35 minutes  Signed,  Denyse Bathe, MD  03/17/2024 10:46 AM    [facility] "

## 2024-04-07 ENCOUNTER — Ambulatory Visit: Admitting: Internal Medicine

## 2024-06-15 ENCOUNTER — Ambulatory Visit: Admitting: Internal Medicine

## 2024-06-23 ENCOUNTER — Ambulatory Visit: Admitting: Cardiovascular Disease
